# Patient Record
Sex: Female | Born: 1995 | Race: White | Hispanic: No | Marital: Single | Smoking: Never smoker
Health system: Southern US, Community
[De-identification: ages and names within clinical notes are randomized; demographics above are authoritative.]

## PROBLEM LIST (undated history)

## (undated) ENCOUNTER — Ambulatory Visit

## (undated) DIAGNOSIS — R5383 Other fatigue: Secondary | ICD-10-CM

## (undated) DIAGNOSIS — E559 Vitamin D deficiency, unspecified: Secondary | ICD-10-CM

## (undated) DIAGNOSIS — D649 Anemia, unspecified: Secondary | ICD-10-CM

## (undated) DIAGNOSIS — N912 Amenorrhea, unspecified: Secondary | ICD-10-CM

## (undated) DIAGNOSIS — W3400XA Accidental discharge from unspecified firearms or gun, initial encounter: Secondary | ICD-10-CM

## (undated) DIAGNOSIS — R0602 Shortness of breath: Secondary | ICD-10-CM

## (undated) DIAGNOSIS — G935 Compression of brain: Secondary | ICD-10-CM

## (undated) DIAGNOSIS — K602 Anal fissure, unspecified: Secondary | ICD-10-CM

## (undated) DIAGNOSIS — D169 Benign neoplasm of bone and articular cartilage, unspecified: Secondary | ICD-10-CM

## (undated) HISTORY — DX: Amenorrhea, unspecified: N91.2

## (undated) HISTORY — DX: Anemia, unspecified: D64.9

## (undated) HISTORY — DX: Vitamin D deficiency, unspecified: E55.9

## (undated) HISTORY — DX: Shortness of breath: R06.02

## (undated) HISTORY — DX: Other fatigue: R53.83

## (undated) HISTORY — DX: Anal fissure, unspecified: K60.2

## (undated) HISTORY — PX: BRAIN SURGERY: SHX531

## (undated) HISTORY — DX: Accidental discharge from unspecified firearms or gun, initial encounter: W34.00XA

---
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
## 2015-02-06 DIAGNOSIS — Y92410 Unspecified street and highway as the place of occurrence of the external cause: Secondary | ICD-10-CM | POA: Insufficient documentation

## 2015-02-06 DIAGNOSIS — S139XXA Sprain of joints and ligaments of unspecified parts of neck, initial encounter: Secondary | ICD-10-CM | POA: Diagnosis not present

## 2015-02-06 DIAGNOSIS — Y9389 Activity, other specified: Secondary | ICD-10-CM | POA: Diagnosis not present

## 2015-02-06 DIAGNOSIS — Z86018 Personal history of other benign neoplasm: Secondary | ICD-10-CM | POA: Insufficient documentation

## 2015-02-06 DIAGNOSIS — S060X1A Concussion with loss of consciousness of 30 minutes or less, initial encounter: Secondary | ICD-10-CM | POA: Diagnosis not present

## 2015-02-06 DIAGNOSIS — S0990XA Unspecified injury of head, initial encounter: Secondary | ICD-10-CM | POA: Diagnosis present

## 2015-02-06 DIAGNOSIS — Z87728 Personal history of other specified (corrected) congenital malformations of nervous system and sense organs: Secondary | ICD-10-CM | POA: Insufficient documentation

## 2015-02-06 DIAGNOSIS — Y999 Unspecified external cause status: Secondary | ICD-10-CM | POA: Insufficient documentation
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
## 2015-02-07 ENCOUNTER — Emergency Department (HOSPITAL_COMMUNITY)
Admission: EM | Admit: 2015-02-07 | Discharge: 2015-02-07 | Disposition: A | Discharge status: Home / Self Care | Payer: BLUE CROSS/BLUE SHIELD | Attending: Emergency Medicine | Admitting: Emergency Medicine

## 2015-02-07 ENCOUNTER — Encounter (HOSPITAL_COMMUNITY): Payer: Self-pay | Admitting: *Deleted

## 2015-02-07 DIAGNOSIS — S060X1A Concussion with loss of consciousness of 30 minutes or less, initial encounter: Secondary | ICD-10-CM

## 2015-02-07 DIAGNOSIS — M62838 Other muscle spasm: Secondary | ICD-10-CM

## 2015-02-07 DIAGNOSIS — S139XXA Sprain of joints and ligaments of unspecified parts of neck, initial encounter: Secondary | ICD-10-CM

## 2015-02-07 MED ORDER — IBUPROFEN 600 MG PO TABS: 600.0000 mg | ORAL_TABLET | Freq: Four times a day (QID) | ORAL | Status: DC | PRN

## 2015-02-07 MED ORDER — OXYCODONE-ACETAMINOPHEN 5-325 MG PO TABS: 1.0000 | ORAL_TABLET | ORAL | Status: DC | PRN

## 2015-02-07 MED ORDER — KETOROLAC TROMETHAMINE 30 MG/ML IJ SOLN
30.0000 mg | Freq: Once | INTRAMUSCULAR | Status: AC
  Administered 2015-02-07: 30 mg via INTRAVENOUS
  Filled 2015-02-07: qty 1

## 2015-02-07 MED ORDER — CYCLOBENZAPRINE HCL 10 MG PO TABS: 10.0000 mg | ORAL_TABLET | Freq: Two times a day (BID) | ORAL | Status: DC | PRN

## 2015-02-07 MED ORDER — METOCLOPRAMIDE HCL 5 MG/ML IJ SOLN
10.0000 mg | Freq: Once | INTRAMUSCULAR | Status: AC
  Administered 2015-02-07: 10 mg via INTRAVENOUS
  Filled 2015-02-07: qty 2

## 2015-02-07 MED ORDER — DIPHENHYDRAMINE HCL 50 MG/ML IJ SOLN
25.0000 mg | Freq: Once | INTRAMUSCULAR | Status: AC
  Administered 2015-02-07: 25 mg via INTRAVENOUS
  Filled 2015-02-07: qty 1

## 2015-02-07 NOTE — ED Notes (Addendum)
Pt was at [facility], states new onset on "confusion" per family. Pt is alert and oriented X 4, also c/o sharp shooting pain from her neck down to her shoulders. Family also requesting results from [facility].
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
## 2015-02-07 NOTE — Discharge Instructions (Signed)
Concussion, Adult °A concussion, or closed-head injury, is a brain injury caused by a direct blow to the head or by a quick and sudden movement (jolt) of the head or neck. Concussions are usually not life-threatening. Even so, the effects of a concussion can be serious. If you have had a concussion before, you are more likely to experience concussion-like symptoms after a direct blow to the head.  °CAUSES °· Direct blow to the head, such as from running into another player during a soccer game, being hit in a fight, or hitting your head on a hard surface. °· A jolt of the head or neck that causes the brain to move back and forth inside the skull, such as in a car crash. °SIGNS AND SYMPTOMS °The signs of a concussion can be hard to notice. Early on, they may be missed by you, family members, and health care providers. You may look fine but act or feel differently. °Symptoms are usually temporary, but they may last for days, weeks, or even longer. Some symptoms may appear right away while others may not show up for hours or days. Every head injury is different. Symptoms include: °· Mild to moderate headaches that will not go away. °· A feeling of pressure inside your head. °· Having more trouble than usual: °¨ Learning or remembering things you have heard. °¨ Answering questions. °¨ Paying attention or concentrating. °¨ Organizing daily tasks. °¨ Making decisions and solving problems. °· Slowness in thinking, acting or reacting, speaking, or reading. °· Getting lost or being easily confused. °· Feeling tired all the time or lacking energy (fatigued). °· Feeling drowsy. °· Sleep disturbances. °¨ Sleeping more than usual. °¨ Sleeping less than usual. °¨ Trouble falling asleep. °¨ Trouble sleeping (insomnia). °· Loss of balance or feeling lightheaded or dizzy. °· Nausea or vomiting. °· Numbness or tingling. °· Increased sensitivity to: °¨ Sounds. °¨ Lights. °¨ Distractions. °· Vision problems or eyes that tire  easily. °· Diminished sense of taste or smell. °· Ringing in the ears. °· Mood changes such as feeling sad or anxious. °· Becoming easily irritated or angry for little or no reason. °· Lack of motivation. °· Seeing or hearing things other people do not see or hear (hallucinations). °DIAGNOSIS °Your health care provider can usually diagnose a concussion based on a description of your injury and symptoms. He or she will ask whether you passed out (lost consciousness) and whether you are having trouble remembering events that happened right before and during your injury. °Your evaluation might include: °· A brain scan to look for signs of injury to the brain. Even if the test shows no injury, you may still have a concussion. °· Blood tests to be sure other problems are not present. °TREATMENT °· Concussions are usually treated in an emergency department, in urgent care, or at a clinic. You may need to stay in the hospital overnight for further treatment. °· Tell your health care provider if you are taking any medicines, including prescription medicines, over-the-counter medicines, and natural remedies. Some medicines, such as blood thinners (anticoagulants) and aspirin, may increase the chance of complications. Also tell your health care provider whether you have had alcohol or are taking illegal drugs. This information may affect treatment. °· Your health care provider will send you home with important instructions to follow. °· How fast you will recover from a concussion depends on many factors. These factors include how severe your concussion is, what part of your brain was injured,   your age, and how healthy you were before the concussion. °· Most people with mild injuries recover fully. Recovery can take time. In general, recovery is slower in older persons. Also, persons who have had a concussion in the past or have other medical problems may find that it takes longer to recover from their current injury. °HOME  CARE INSTRUCTIONS °General Instructions °· Carefully follow the directions your health care provider gave you. °· Only take over-the-counter or prescription medicines for pain, discomfort, or fever as directed by your health care provider. °· Take only those medicines that your health care provider has approved. °· Do not drink alcohol until your health care provider says you are well enough to do so. Alcohol and certain other drugs may slow your recovery and can put you at risk of further injury. °· If it is harder than usual to remember things, write them down. °· If you are easily distracted, try to do one thing at a time. For example, do not try to watch TV while fixing dinner. °· Talk with family members or close friends when making important decisions. °· Keep all follow-up appointments. Repeated evaluation of your symptoms is recommended for your recovery. °· Watch your symptoms and tell others to do the same. Complications sometimes occur after a concussion. Older adults with a brain injury may have a higher risk of serious complications, such as a blood clot on the brain. °· Tell your teachers, school nurse, school counselor, coach, athletic trainer, or work manager about your injury, symptoms, and restrictions. Tell them about what you can or cannot do. They should watch for: °¨ Increased problems with attention or concentration. °¨ Increased difficulty remembering or learning new information. °¨ Increased time needed to complete tasks or assignments. °¨ Increased irritability or decreased ability to cope with stress. °¨ Increased symptoms. °· Rest. Rest helps the brain to heal. Make sure you: °¨ Get plenty of sleep at night. Avoid staying up late at night. °¨ Keep the same bedtime hours on weekends and weekdays. °¨ Rest during the day. Take daytime naps or rest breaks when you feel tired. °· Limit activities that require a lot of thought or concentration. These include: °¨ Doing homework or job-related  work. °¨ Watching TV. °¨ Working on the computer. °· Avoid any situation where there is potential for another head injury (football, hockey, soccer, basketball, martial arts, downhill snow sports and horseback riding). Your condition will get worse every time you experience a concussion. You should avoid these activities until you are evaluated by the appropriate follow-up health care providers. °Returning To Your Regular Activities °You will need to return to your normal activities slowly, not all at once. You must give your body and brain enough time for recovery. °· Do not return to sports or other athletic activities until your health care provider tells you it is safe to do so. °· Ask your health care provider when you can drive, ride a bicycle, or operate heavy machinery. Your ability to react may be slower after a brain injury. Never do these activities if you are dizzy. °· Ask your health care provider about when you can return to work or school. °Preventing Another Concussion °It is very important to avoid another brain injury, especially before you have recovered. In rare cases, another injury can lead to permanent brain damage, brain swelling, or death. The risk of this is greatest during the first 7-10 days after a head injury. Avoid injuries by: °· Wearing a   seat belt when riding in a car.  Drinking alcohol only in moderation.  Wearing a helmet when biking, skiing, skateboarding, skating, or doing similar activities.  Avoiding activities that could lead to a second concussion, such as contact or recreational sports, until your health care provider says it is okay.  Taking safety measures in your home.  Remove clutter and tripping hazards from floors and stairways.  Use grab bars in bathrooms and handrails by stairs.  Place non-slip mats on floors and in bathtubs.  Improve lighting in dim areas. SEEK MEDICAL CARE IF:  You have increased problems paying attention or  concentrating.  You have increased difficulty remembering or learning new information.  You need more time to complete tasks or assignments than before.  You have increased irritability or decreased ability to cope with stress.  You have more symptoms than before. Seek medical care if you have any of the following symptoms for more than 2 weeks after your injury:  Lasting (chronic) headaches.  Dizziness or balance problems.  Nausea.  Vision problems.  Increased sensitivity to noise or light.  Depression or mood swings.  Anxiety or irritability.  Memory problems.  Difficulty concentrating or paying attention.  Sleep problems.  Feeling tired all the time. SEEK IMMEDIATE MEDICAL CARE IF:  You have severe or worsening headaches. These may be a sign of a blood clot in the brain.  You have weakness (even if only in one hand, leg, or part of the face).  You have numbness.  You have decreased coordination.  You vomit repeatedly.  You have increased sleepiness.  One pupil is larger than the other.  You have convulsions.  You have slurred speech.  You have increased confusion. This may be a sign of a blood clot in the brain.  You have increased restlessness, agitation, or irritability.  You are unable to recognize people or places.  You have neck pain.  It is difficult to wake you up.  You have unusual behavior changes.  You lose consciousness. MAKE SURE YOU:  Understand these instructions.  Will watch your condition.  Will get help right away if you are not doing well or get worse.   This information is not intended to replace advice given to you by your health care provider. Make sure you discuss any questions you have with your health care provider.   Document Released: 03/12/2003 Document Revised: 01/10/2014 Document Reviewed: 07/12/2012 Elsevier Interactive Patient Education 2016 [place] have neck pain, possibly from a cervical  strain and/or pinched nerve.   SEEK IMMEDIATE MEDICAL ATTENTION IF: You develop difficulties swallowing or breathing.  You have new or worse numbness, weakness, tingling, or movement problems in your arms or legs.  You develop increasing pain which is uncontrolled with medications.  You have change in bowel or bladder function, or other concerns.

## 2015-04-29 ENCOUNTER — Encounter: Payer: Self-pay | Admitting: Neurology

## 2015-04-29 ENCOUNTER — Ambulatory Visit (INDEPENDENT_AMBULATORY_CARE_PROVIDER_SITE_OTHER): Payer: BLUE CROSS/BLUE SHIELD | Admitting: Neurology

## 2015-04-29 VITALS — BP 112/66 | HR 78 | Resp 16 | Ht 64.0 in | Wt 166.0 lb

## 2015-04-29 DIAGNOSIS — F0781 Postconcussional syndrome: Secondary | ICD-10-CM

## 2015-04-29 DIAGNOSIS — S060X0S Concussion without loss of consciousness, sequela: Secondary | ICD-10-CM

## 2015-04-29 DIAGNOSIS — Z9889 Other specified postprocedural states: Secondary | ICD-10-CM

## 2015-04-29 DIAGNOSIS — M6289 Other specified disorders of muscle: Secondary | ICD-10-CM | POA: Diagnosis not present

## 2015-04-29 DIAGNOSIS — R531 Weakness: Secondary | ICD-10-CM
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
## 2015-04-29 NOTE — Patient Instructions (Addendum)
Post-concussion syndrome is a complex disorder which presents with a variety of symptoms, including headache, dizziness, cognitive issues, balance issues, mood related symptoms and motor issues. These can last for weeks and sometimes months after the initial head injury. Concussion is a mild form of traumatic brain injury, and usually occurs after a blow to the head. Loss of consciousness is not a requirement for the diagnosis of concussion or of postconcussion syndrome. The risk of post concussive symptoms does not appear to be correlated with the severity of the initial injury. Most people who have a concussion are without any residual symptoms. Some patients have symptoms that occur within the first 7-10 days of the initial injury and had complete resolution of her symptoms within 3 months. However, keep in mind that postconcussive symptoms can persist for a year and sometimes even longer. There is no specific test for the diagnosis of concussion. There is no specific treatment for concussion or postconcussive symptoms and care is supportive. Treatment is geared towards improving symptoms. Medications commonly used for migraines or tension headaches, including some antidepressants, appear to be helpful with postconcussive headaches. Medications include amitriptyline, Topamax, or gabapentin and others. Keep in mind that overuse of over-the-counter pain medications can exacerbate post concussion headaches.  There are no medications currently recommended specifically for cognitive complaints after mild traumatic brain injury. Usually, time is the best treatment for postconcussive cognitive issues as most of the cognitive complaints resolve on their own in the first few weeks or months after the injury. Sometimes a referral to a psychiatrist or psychologist is helpful. Certain forms of cognitive therapy may be helpful, and relaxation therapy may also help.  You had multiple scans done including head CT at the time  of her accident. All of the findings were fairly reassuring.  We will do an EEG (brainwave test), which we will schedule. We will call you with the results.  We will do a brain scan, called MRI and call you with the test results. We will have to schedule you for this on a separate date. This test requires authorization from your insurance, and we will take care of the insurance process.  We will consider a formal neuropsychological test (aka cognitive testing) for your memory complaints. This requires a referral to a trained and licensed neuropsychologist and will be a separate appointment at a different clinic. We will discuss at our 3 month follow up. We will give it some more time at this juncture.   You have stable R arm and hand weakness, otherwise a normal neurological exam.  Please remember to try to maintain good sleep hygiene, which means: Keep a regular sleep and wake schedule, try not to exercise or have a meal within 2 hours of your bedtime, try to keep your bedroom conducive for sleep, that is, cool and dark, without light distractors such as an illuminated alarm clock, and refrain from watching TV right before sleep or in the middle of the night and do not keep the TV or radio on during the night. Also, try not to use or play on electronic devices at bedtime, such as your cell phone, tablet PC or laptop. If you like to read at bedtime on an electronic device, try to dim the background light as much as possible. Do not eat in the middle of the night.   Stay well hydrated with water.

## 2015-05-06 ENCOUNTER — Other Ambulatory Visit: Payer: BLUE CROSS/BLUE SHIELD

## 2015-05-26 ENCOUNTER — Other Ambulatory Visit: Payer: BLUE CROSS/BLUE SHIELD

## 2015-07-01 ENCOUNTER — Ambulatory Visit (INDEPENDENT_AMBULATORY_CARE_PROVIDER_SITE_OTHER): Payer: BLUE CROSS/BLUE SHIELD

## 2015-07-01 DIAGNOSIS — S060X0S Concussion without loss of consciousness, sequela: Secondary | ICD-10-CM | POA: Diagnosis not present

## 2015-07-01 DIAGNOSIS — F0781 Postconcussional syndrome: Secondary | ICD-10-CM | POA: Diagnosis not present

## 2015-07-01 DIAGNOSIS — M6289 Other specified disorders of muscle: Secondary | ICD-10-CM | POA: Diagnosis not present

## 2015-07-01 DIAGNOSIS — Z9889 Other specified postprocedural states: Secondary | ICD-10-CM

## 2015-07-01 DIAGNOSIS — R531 Weakness: Secondary | ICD-10-CM

## 2015-07-02 MED ORDER — GADOPENTETATE DIMEGLUMINE 469.01 MG/ML IV SOLN: 15.0000 mL | Freq: Once | INTRAVENOUS | Status: DC | PRN

## 2015-07-03 NOTE — Progress Notes (Signed)
Quick Note:  Please call patient: MRI brain w/wo contrast confirms postsurgical changes from her decompression surgery in 2011 for her Chiari malformation, otherwise normal looking brain and cerebellum and normal looking upper cervical cord as much as is visible on a brain MRI. No acute findings, reassuring results. Star Age, MD, PhD [facility] ([facility])  ______

## 2015-07-06 ENCOUNTER — Telehealth: Payer: Self-pay

## 2015-07-06 NOTE — Telephone Encounter (Signed)
I spoke to patient and she is aware of results. She has not had EEG yet, I encouraged her to go ahead and get that scheduled before next appt.

## 2015-07-06 NOTE — Telephone Encounter (Signed)
-----   Message from Star Age, MD sent at 07/03/2015  1:28 PM EDT ----- Please call patient: MRI brain w/wo contrast confirms postsurgical changes from her decompression surgery in 2011 for her Chiari malformation, otherwise normal looking brain and cerebellum and normal looking upper cervical cord as much as is visible on a brain MRI. No acute findings, reassuring results. Star Age, MD, PhD [facility])

## 2015-07-30 ENCOUNTER — Ambulatory Visit: Payer: BLUE CROSS/BLUE SHIELD | Admitting: Neurology

## 2015-08-04 ENCOUNTER — Ambulatory Visit: Payer: BLUE CROSS/BLUE SHIELD | Admitting: Neurology

## 2015-08-05 ENCOUNTER — Encounter: Payer: Self-pay | Admitting: Neurology

## 2016-01-25 ENCOUNTER — Encounter: Payer: Self-pay | Admitting: Gastroenterology

## 2016-02-02 ENCOUNTER — Encounter: Payer: Self-pay | Admitting: Gastroenterology

## 2016-02-02 ENCOUNTER — Ambulatory Visit (INDEPENDENT_AMBULATORY_CARE_PROVIDER_SITE_OTHER): Payer: BLUE CROSS/BLUE SHIELD | Admitting: Gastroenterology

## 2016-02-02 VITALS — BP 86/50 | HR 88 | Ht 63.0 in | Wt 172.5 lb

## 2016-02-02 DIAGNOSIS — K602 Anal fissure, unspecified: Secondary | ICD-10-CM

## 2016-02-02 MED ORDER — DILTIAZEM GEL 2 %: 1.0000 "application " | Freq: Three times a day (TID) | CUTANEOUS | 3 refills | Status: DC

## 2016-02-02 NOTE — Progress Notes (Signed)
HPI: This is a  very pleasant 21 year old woman  who was referred to me by Vernie Shanks, MD  to evaluate  chronic anal fissure .    Chief complaint is anal fissure  Old Data Reviewed: [facility] note reviewed from 07/2015" persistent anal fissure" treated with NTg ointments without success, referral to GI.  Anal fissure for about a year.  Started after she moved into dorms, had many bouts of constipation then.  Has Tried NTg cream for 2 months without improvement.    Hurts with every BM.  Has BM every other day.  Will streak with blood with BM as well.  She has no fevers or chills. No significant diarrhea   Review of systems: Pertinent positive and negative review of systems were noted in the above HPI section. Complete review of systems was performed and was otherwise normal.   Past Medical History:  Diagnosis Date  . 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 Bone tumor (benign)     Past Surgical History:  Procedure Laterality Date  . BRAIN SURGERY     arnold chiari malformation repair in 2010    No current outpatient prescriptions on file.   No current facility-administered medications for this visit.    Facility-Administered Medications Ordered in Other Visits  Medication Dose Route Frequency Provider Last Rate Last Dose  . gadopentetate dimeglumine (MAGNEVIST) injection 15 mL  15 mL Intravenous Once PRN Star Age, MD        Allergies as of 02/02/2016  . (No Known Allergies)    Family History  Problem Relation Age of Onset  . Healthy Mother   . Colon cancer Neg Hx   . Rectal cancer Neg Hx   . Stomach cancer Neg Hx   . Liver cancer Neg Hx   . Esophageal cancer Neg Hx     Social History   Social History  . Marital status: Single    Spouse name: N/A  . Number of children: 0  . Years of education: N/A   Occupational History  . STUDENT    Social History Main Topics  . Smoking status: Never Smoker  .  Smokeless tobacco: Never Used  . Alcohol use No  . Drug use: No  . Sexual activity: Not on file   Other Topics Concern  . Not on file   Social History Narrative   Rare caffeine use      Physical Exam: BP (!) 86/50   Pulse 88   Ht 5\' 3"  (1.6 m)   Wt 172 lb 8 oz (78.2 kg)   BMI 30.56 kg/m  Constitutional: generally well-appearing Psychiatric: alert and oriented x3 Eyes: extraocular movements intact Mouth: oral pharynx moist, no lesions Neck: supple no lymphadenopathy Cardiovascular: heart regular rate and rhythm Lungs: clear to auscultation bilaterally Abdomen: soft, nontender, nondistended, no obvious ascites, no peritoneal signs, normal bowel sounds Extremities: no lower extremity edema bilaterally Skin: no lesions on visible extremities Rectal exam with female assistant in the room: Shallow, 5-8 mm midline posterior anal fissure that was tender to palpation, no fluctuance. I did not insert finger into the rectum due to the pain  Assessment and plan: 21 y.o. female with  chronic anal fissure  We're going to try with conservative measures a bit further before referral to surgeon for fissurotomy; diltiazem gel 3 times daily, sitz baths once to twice daily, fiber supplements. She'll return to see me in 6-8 weeks and sooner  if needed. If not having any significant progress by then I will likely refer her to a general surgeon.  No signs of underlying IBD    Please see the "Patient Instructions" section for addition details about the plan.   Owens Loffler, MD [facility] 02/02/2016, 2:04 PM  Cc: Vernie Shanks, MD

## 2016-02-02 NOTE — Patient Instructions (Addendum)
Trial of diltiazem gel, applied three times daily to the anal fissure for the next 6 weeks.  Sitz baths twice daily in warm water.  We have sent a prescription for Diltiazem gel 2%  to [facility]. You should apply a pea size amount to your rectum three times daily x 6-8 weeks.  [facility]'s information is below: Address: [address] [place], [place] [address]  [phone]  Please start taking citrucel (orange flavored) powder fiber supplement.  This may cause some bloating at first but that usually goes away. Begin with a small spoonful and work your way up to a large, heaping spoonful daily over a week.  Please return to see Dr. Ardis Hughs on 03/23/16 3:45pm

## 2016-03-12 ENCOUNTER — Encounter (HOSPITAL_COMMUNITY): Payer: Self-pay | Admitting: Emergency Medicine

## 2016-03-12 ENCOUNTER — Emergency Department (HOSPITAL_COMMUNITY): Payer: BLUE CROSS/BLUE SHIELD

## 2016-03-12 ENCOUNTER — Emergency Department (HOSPITAL_COMMUNITY)
Admission: EM | Admit: 2016-03-12 | Discharge: 2016-03-12 | Disposition: A | Discharge status: Home / Self Care | Payer: BLUE CROSS/BLUE SHIELD | Attending: Emergency Medicine | Admitting: Emergency Medicine

## 2016-03-12 DIAGNOSIS — R1011 Right upper quadrant pain: Secondary | ICD-10-CM | POA: Diagnosis not present

## 2016-03-12 DIAGNOSIS — L03113 Cellulitis of right upper limb: Secondary | ICD-10-CM | POA: Diagnosis not present

## 2016-03-12 DIAGNOSIS — R109 Unspecified abdominal pain: Secondary | ICD-10-CM

## 2016-03-12 LAB — URINALYSIS, ROUTINE W REFLEX MICROSCOPIC
Bacteria, UA: NONE SEEN
Bilirubin Urine: NEGATIVE
Glucose, UA: NEGATIVE mg/dL
Ketones, ur: NEGATIVE mg/dL
Leukocytes, UA: NEGATIVE
Nitrite: NEGATIVE
Protein, ur: NEGATIVE mg/dL
Specific Gravity, Urine: 1.01 (ref 1.005–1.030)
pH: 6 (ref 5.0–8.0)

## 2016-03-12 LAB — CBC WITH DIFFERENTIAL/PLATELET
Basophils Absolute: 0 10*3/uL (ref 0.0–0.1)
Basophils Relative: 0 %
Eosinophils Absolute: 0.1 10*3/uL (ref 0.0–0.7)
Eosinophils Relative: 2 %
HCT: 37.1 % (ref 36.0–46.0)
Hemoglobin: 11.9 g/dL — ABNORMAL LOW (ref 12.0–15.0)
Lymphocytes Relative: 28 %
Lymphs Abs: 2 10*3/uL (ref 0.7–4.0)
MCH: 27.7 pg (ref 26.0–34.0)
MCHC: 32.1 g/dL (ref 30.0–36.0)
MCV: 86.3 fL (ref 78.0–100.0)
Monocytes Absolute: 0.6 10*3/uL (ref 0.1–1.0)
Monocytes Relative: 9 %
Neutro Abs: 4.3 10*3/uL (ref 1.7–7.7)
Neutrophils Relative %: 61 %
Platelets: 224 10*3/uL (ref 150–400)
RBC: 4.3 MIL/uL (ref 3.87–5.11)
RDW: 13 % (ref 11.5–15.5)
WBC: 7.1 10*3/uL (ref 4.0–10.5)

## 2016-03-12 LAB — COMPREHENSIVE METABOLIC PANEL
ALT: 12 U/L — ABNORMAL LOW (ref 14–54)
AST: 15 U/L (ref 15–41)
Albumin: 3.7 g/dL (ref 3.5–5.0)
Alkaline Phosphatase: 45 U/L (ref 38–126)
Anion gap: 10 (ref 5–15)
BUN: 11 mg/dL (ref 6–20)
CO2: 23 mmol/L (ref 22–32)
Calcium: 9 mg/dL (ref 8.9–10.3)
Chloride: 104 mmol/L (ref 101–111)
Creatinine, Ser: 0.73 mg/dL (ref 0.44–1.00)
GFR calc Af Amer: >60 mL/min (ref >60)
GFR calc non Af Amer: >60 mL/min (ref >60)
Glucose, Bld: 88 mg/dL (ref 65–99)
Potassium: 4 mmol/L (ref 3.5–5.1)
Sodium: 137 mmol/L (ref 135–145)
Total Bilirubin: 0.6 mg/dL (ref 0.3–1.2)
Total Protein: 6.4 g/dL — ABNORMAL LOW (ref 6.5–8.1)

## 2016-03-12 LAB — POC URINE PREG, ED: Preg Test, Ur: NEGATIVE

## 2016-03-12 LAB — LIPASE, BLOOD: Lipase: 13 U/L (ref 11–51)

## 2016-03-12 MED ORDER — CEPHALEXIN 500 MG PO CAPS: 500.0000 mg | ORAL_CAPSULE | Freq: Four times a day (QID) | ORAL | 0 refills | Status: DC

## 2016-03-12 NOTE — ED Triage Notes (Signed)
Pt. Stated, I've been having off and on cramping for 2 days.  Also I have this scaliness on my rt. Arm were I busted 2 boils.

## 2016-03-12 NOTE — ED Provider Notes (Signed)
[facility] DEPT Provider Note   CSN: [id number] Arrival date & time: 03/12/16  1138     History   Chief Complaint Chief Complaint  Patient presents with  . Abdominal Cramping  . Arm Pain    HPI Sonya Hatfield is a 21 y.o. female.  The history is provided by the patient and a relative. No language interpreter was used.  Abdominal Cramping   Arm Pain    Sonya Hatfield is a 21 y.o. female who presents to the Emergency Department complaining of abdominal pain.  She reports 2-3 days of intermittent right upper quadrant pain that radiates to the right back. Pain is severe and waxing and waning in nature. When it occurs is worse with movement and deep breaths but she has no symptoms in between episodes. No fevers, nausea, vomiting, diarrhea, dysuria, constipation. No prior similar symptoms. She also reports bumps to her right arm that were pills that she squeezed them popped a few days ago and now she has developing redness and swelling to the right forearm and elbow. She has pain in that area but is able to range it without difficulty. Past Medical History:  Diagnosis Date  . 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 Bone tumor (benign)     There are no active problems to display for this patient.   Past Surgical History:  Procedure Laterality Date  . BRAIN SURGERY     arnold chiari malformation repair in 2010    OB History    No data available       Home Medications    Prior to Admission medications   Medication Sig Start Date End Date Taking? Authorizing Provider  cephALEXin (KEFLEX) 500 MG capsule Take 1 capsule (500 mg total) by mouth 4 (four) times daily. 03/12/16   Quintella Reichert, MD  diltiazem 2 % GEL Apply 1 application topically 3 (three) times daily. 6-8 weeks 02/02/16   Milus Banister, MD    Family History Family History  Problem Relation Age of Onset  . Healthy Mother   . Other Father     history of bone tumor    . Colon cancer Neg Hx   . Rectal cancer Neg Hx   . Stomach cancer Neg Hx   . Liver cancer Neg Hx   . Esophageal cancer Neg Hx     Social History Social History  Substance Use Topics  . Smoking status: Never Smoker  . Smokeless tobacco: Never Used  . Alcohol use No     Allergies   Patient has no known allergies.   Review of Systems Review of Systems  All other systems reviewed and are negative.    Physical Exam Updated Vital Signs BP 93/67 (BP Location: Right Arm)   Pulse 76   Temp 98.9 F (37.2 C) (Oral)   Resp 18   Ht 5\' 4"  (1.626 m)   Wt 167 lb (75.8 kg)   LMP 03/08/2016   SpO2 100%   BMI 28.67 kg/m   Physical Exam  Constitutional: She is oriented to person, place, and time. She appears well-developed and well-nourished.  HENT:  Head: Normocephalic and atraumatic.  Cardiovascular: Normal rate and regular rhythm.   No murmur heard. Pulmonary/Chest: Effort normal and breath sounds normal. No respiratory distress.  Abdominal: Soft. There is no tenderness. There is no rebound and no guarding.  Musculoskeletal:  2+ radial pulses bilaterally. There is mild erythema and firm induration with mild local  tenderness to the right proximal forearm and up to the elbow. Range of motion is intact throughout the elbow. No focal fluctuance or abscess.  Neurological: She is alert and oriented to person, place, and time.  Skin: Skin is warm and dry.  Psychiatric: She has a normal mood and affect. Her behavior is normal.  Nursing note and vitals reviewed.    ED Treatments / Results  Labs (all labs ordered are listed, but only abnormal results are displayed) Labs Reviewed  URINALYSIS, ROUTINE W REFLEX MICROSCOPIC - Abnormal; Notable for the following:       Result Value   Color, Urine STRAW (*)    Hgb urine dipstick LARGE (*)    Squamous Epithelial / LPF 0-5 (*)    All other components within normal limits  COMPREHENSIVE METABOLIC PANEL - Abnormal; Notable for the  following:    Total Protein 6.4 (*)    ALT 12 (*)    All other components within normal limits  CBC WITH DIFFERENTIAL/PLATELET - Abnormal; Notable for the following:    Hemoglobin 11.9 (*)    All other components within normal limits  LIPASE, BLOOD  POC URINE PREG, ED    EKG  EKG Interpretation None       Radiology Dg Elbow Complete Right  Result Date: 03/12/2016 CLINICAL DATA:  Two boils involving the posterior aspect of the right elbow EXAM: RIGHT ELBOW - COMPLETE 3+ VIEW COMPARISON:  None. FINDINGS: There is rather diffuse subcutaneous stranding and edema involving the posterior aspect of the elbow and imaged proximal forearm. No radiopaque foreign body or subcutaneous emphysema. No discrete area of osteolysis to suggest osteomyelitis. No fracture or elbow joint effusion.  Joint spaces are preserved. IMPRESSION: Nonspecific diffuse soft tissue swelling about the posterior aspect of the elbow and proximal forearm without associated radiopaque foreign body, subcutaneous emphysema or radiographic evidence of osteomyelitis. Electronically Signed   By: Sandi Mariscal M.D.   On: 03/12/2016 13:24   US Abdomen Complete  Result Date: 03/12/2016 CLINICAL DATA:  Right-sided abdominal pain for 3 days. EXAM: ABDOMEN ULTRASOUND COMPLETE COMPARISON:  None. FINDINGS: Gallbladder: No gallstones or gallbladder wall thickening. No pericholecystic fluid. The sonographer reports no sonographic Murphy's sign. Common bile duct: Diameter: 3 mm Liver: No focal lesion identified. Within normal limits in parenchymal echogenicity. IVC: No abnormality visualized. Pancreas: Visualized portion unremarkable. Spleen: Size and appearance within normal limits. Right Kidney: Length: 9.7 cm. Echogenicity within normal limits. No mass or hydronephrosis visualized. Left Kidney: Length: 10.6 cm. Echogenicity within normal limits. No mass or hydronephrosis visualized. Abdominal aorta: No aneurysm visualized. Other findings: None.  IMPRESSION: Unremarkable abdominal ultrasound. Specifically, no findings to explain the patient's history of right-sided pain. Electronically Signed   By: Misty Stanley M.D.   On: 03/12/2016 14:44    Procedures Procedures (including critical care time)  Medications Ordered in ED Medications - No data to display   Initial Impression / Assessment and Plan / ED Course  I have reviewed the triage vital signs and the nursing notes.  Pertinent labs & imaging results that were available during my care of the patient were reviewed by me and considered in my medical decision making (see chart for details).    Patient here for evaluation of intermittent right-sided abdominal pain, no appreciable pain in the emergency department at this time. She is also here for evaluation of swelling to her right arm. Arm exam is concerning for cellulitis. There is no evidence of abscess or deep tissue space  infection. No evidence of septic arthritis. Will treat with Keflex. In terms of her abdominal pain, presentation is not consistent with cholecystitis, pancreatitis, perforated viscus, peptic ulcer disease, renal colic. UA with hematuria the patient is currently on her cycle. The patient on home care for abdominal pain, cellulitis.  Discussed outpatient follow-up and return precautions.   Final Clinical Impressions(s) / ED Diagnoses   Final diagnoses:  Right sided abdominal pain  Cellulitis of right upper extremity    New Prescriptions Discharge Medication List as of 03/12/2016  3:04 PM    START taking these medications   Details  cephALEXin (KEFLEX) 500 MG capsule Take 1 capsule (500 mg total) by mouth 4 (four) times daily., Starting Sat 03/12/2016, Print         Quintella Reichert, MD 03/12/16 [phone]

## 2016-03-12 NOTE — ED Notes (Signed)
Patient transported to X-ray 

## 2016-03-23 ENCOUNTER — Ambulatory Visit: Payer: BLUE CROSS/BLUE SHIELD | Admitting: Gastroenterology

## 2016-05-31 ENCOUNTER — Encounter (HOSPITAL_COMMUNITY): Payer: Self-pay | Admitting: *Deleted

## 2016-05-31 ENCOUNTER — Ambulatory Visit (HOSPITAL_COMMUNITY)
Admission: EM | Admit: 2016-05-31 | Discharge: 2016-05-31 | Disposition: A | Discharge status: Home / Self Care | Payer: BLUE CROSS/BLUE SHIELD | Attending: Family Medicine | Admitting: Family Medicine

## 2016-05-31 DIAGNOSIS — B9789 Other viral agents as the cause of diseases classified elsewhere: Secondary | ICD-10-CM | POA: Diagnosis not present

## 2016-05-31 DIAGNOSIS — J069 Acute upper respiratory infection, unspecified: Secondary | ICD-10-CM

## 2016-05-31 DIAGNOSIS — R05 Cough: Secondary | ICD-10-CM | POA: Diagnosis present

## 2016-05-31 DIAGNOSIS — H6123 Impacted cerumen, bilateral: Secondary | ICD-10-CM | POA: Diagnosis not present

## 2016-05-31 LAB — POCT RAPID STREP A: Streptococcus, Group A Screen (Direct): NEGATIVE

## 2016-05-31 MED ORDER — BENZONATATE 100 MG PO CAPS: 100.0000 mg | ORAL_CAPSULE | Freq: Three times a day (TID) | ORAL | 0 refills | Status: DC

## 2016-05-31 NOTE — Discharge Instructions (Signed)
You most likely have a viral URI, I advise rest, plenty of fluids and management of symptoms with over the counter medicines. For symptoms you may take Tylenol as needed every 4-6 hours for body aches or fever, not to exceed 4,000 mg a day, Take mucinex or mucinex DM ever 12 hours with a full glass of water, you may use an inhaled steroid such as Flonase, 2 sprays each nostril once a day for congestion, or an antihistamine such as Claritin or Zyrtec once a day. For cough, I have prescribed a medication called Tessalon. Take 1 tablet every 8 hours as needed for your cough. Should your symptoms worsen or fail to resolve, follow up with your primary care provider or return to clinic.

## 2016-05-31 NOTE — ED Triage Notes (Signed)
Sorethroat     And  Headache  X  4  Days    Developed   A    Cough    2  Days    Body  Aches

## 2016-05-31 NOTE — ED Provider Notes (Signed)
CSN: [id number]     Arrival date & time 05/31/16  1158 History   First MD Initiated Contact with Patient 05/31/16 1309     Chief Complaint  Patient presents with  . Cough   (Consider location/radiation/quality/duration/timing/severity/associated sxs/prior Treatment) The history is provided by the patient.  Cough  Cough characteristics:  Non-productive, dry and hacking Sputum characteristics:  Clear Severity:  Moderate Onset quality:  Gradual Duration:  4 days Timing:  Constant Progression:  Unchanged Chronicity:  New Smoker: no   Context: upper respiratory infection   Relieved by:  Nothing Worsened by:  Nothing Ineffective treatments:  None tried Associated symptoms: fever, rhinorrhea, sinus congestion and sore throat   Associated symptoms: no chest pain, no chills, no ear pain, no headaches and no wheezing     Past Medical History:  Diagnosis Date  . 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 Bone tumor (benign)    Past Surgical History:  Procedure Laterality Date  . BRAIN SURGERY     arnold chiari malformation repair in 2010   Family History  Problem Relation Age of Onset  . Healthy Mother   . Other Father        history of bone tumor  . Colon cancer Neg Hx   . Rectal cancer Neg Hx   . Stomach cancer Neg Hx   . Liver cancer Neg Hx   . Esophageal cancer Neg Hx    Social History  Substance Use Topics  . Smoking status: Never Smoker  . Smokeless tobacco: Never Used  . Alcohol use No   OB History    No data available     Review of Systems  Constitutional: Positive for fever. Negative for chills.  HENT: Positive for rhinorrhea and sore throat. Negative for ear pain.   Respiratory: Positive for cough. Negative for wheezing.   Cardiovascular: Negative for chest pain.  Gastrointestinal: Negative.   Musculoskeletal: Negative.   Skin: Negative.   Neurological: Negative for light-headedness and headaches.    Allergies   Patient has no known allergies.  Home Medications   Prior to Admission medications   Medication Sig Start Date End Date Taking? Authorizing Provider  benzonatate (TESSALON) 100 MG capsule Take 1 capsule (100 mg total) by mouth every 8 (eight) hours. 05/31/16   Barnet Glasgow, NP   Meds Ordered and Administered this Visit  Medications - No data to display  BP (!) 98/54 (BP Location: Left Arm) Comment: notified rn  Pulse 70   Temp 98 F (36.7 C) (Oral)   Resp 16   LMP 05/17/2016   SpO2 100%  No data found.   Physical Exam  Constitutional: She is oriented to person, place, and time. She appears well-developed and well-nourished. No distress.  HENT:  Head: Normocephalic.  Right Ear: External ear normal.  Left Ear: External ear normal.  Nose: Nose normal.  Mouth/Throat: Uvula is midline, oropharynx is clear and moist and mucous membranes are normal.  Bilateral cerumen impaction  Eyes: Conjunctivae are normal.  Neck: Normal range of motion. Neck supple.  Cardiovascular: Normal rate and regular rhythm.   Pulmonary/Chest: Effort normal and breath sounds normal.  Neurological: She is alert and oriented to person, place, and time.  Skin: Skin is warm and dry. Capillary refill takes less than 2 seconds. She is not diaphoretic.  Psychiatric: She has a normal mood and affect. Her behavior is normal.  Nursing note and vitals reviewed.   Urgent Care  Course     Procedures (including critical care time)  Labs Review Labs Reviewed  POCT RAPID STREP A    Imaging Review No results found.    MDM   1. Viral URI with cough    Strep test negative, most likely viral URI. Given Tessalon for cough, provided counseling on over-the-counter therapies for symptom management, provided work note. Ears irrigated in clinic.     Barnet Glasgow, NP 05/31/16 1752

## 2016-06-02 LAB — CULTURE, GROUP A STREP (THRC)

## 2016-06-29 ENCOUNTER — Other Ambulatory Visit: Payer: Self-pay

## 2017-05-16 LAB — OB RESULTS CONSOLE HIV ANTIBODY (ROUTINE TESTING): HIV: NONREACTIVE

## 2017-05-16 LAB — OB RESULTS CONSOLE ABO/RH: RH Type: POSITIVE

## 2017-05-16 LAB — OB RESULTS CONSOLE GC/CHLAMYDIA
Chlamydia: NEGATIVE
Gonorrhea: NEGATIVE

## 2017-05-16 LAB — OB RESULTS CONSOLE ANTIBODY SCREEN: Antibody Screen: NEGATIVE

## 2017-05-16 LAB — OB RESULTS CONSOLE RPR: RPR: NONREACTIVE

## 2017-05-16 LAB — OB RESULTS CONSOLE RUBELLA ANTIBODY, IGM: Rubella: IMMUNE

## 2017-05-16 LAB — OB RESULTS CONSOLE HEPATITIS B SURFACE ANTIGEN: Hepatitis B Surface Ag: NEGATIVE

## 2017-05-22 DIAGNOSIS — Z3490 Encounter for supervision of normal pregnancy, unspecified, unspecified trimester: Secondary | ICD-10-CM | POA: Insufficient documentation

## 2017-05-23 DIAGNOSIS — E559 Vitamin D deficiency, unspecified: Secondary | ICD-10-CM | POA: Insufficient documentation

## 2017-05-30 DIAGNOSIS — G935 Compression of brain: Secondary | ICD-10-CM | POA: Insufficient documentation

## 2017-08-29 ENCOUNTER — Telehealth: Payer: Self-pay

## 2017-08-29 NOTE — Telephone Encounter (Signed)
I have asked our referrals team to let the referring office know that pt should see neurosurgery, either at [facility] or here locally.

## 2017-08-29 NOTE — Telephone Encounter (Signed)
Nothing further needed, other than I recommend opinion with neurosurgery.

## 2017-08-29 NOTE — Telephone Encounter (Signed)
I called pt per Dr. Guadelupe Sabin request. Dr. Rexene Alberts cannot comment on pt's Chiari Syndrome and her pregnancy. The referral states that pt has had a repair at [facility] and therefore Dr. Rexene Alberts recommends that pt see NSY at [facility] regarding this. Pt verbalized displeasure in this because she has waited a while for this appt. She will speak to her referring OBGYN regarding this. Pt's appt has been cancelled for tomorrow. Pt verbalized understanding.

## 2017-08-30 ENCOUNTER — Institutional Professional Consult (permissible substitution): Payer: BLUE CROSS/BLUE SHIELD | Admitting: Neurology

## 2017-09-05 DIAGNOSIS — D649 Anemia, unspecified: Secondary | ICD-10-CM | POA: Insufficient documentation

## 2017-09-06 NOTE — Telephone Encounter (Signed)
This encounter was created in error - please disregard.

## 2017-11-10 LAB — OB RESULTS CONSOLE GBS: GBS: NEGATIVE

## 2017-12-04 ENCOUNTER — Telehealth (HOSPITAL_COMMUNITY): Payer: Self-pay | Admitting: *Deleted

## 2017-12-04 ENCOUNTER — Encounter (HOSPITAL_COMMUNITY): Payer: Self-pay | Admitting: *Deleted

## 2017-12-04 NOTE — Telephone Encounter (Signed)
Preadmission screen  

## 2017-12-06 ENCOUNTER — Other Ambulatory Visit: Payer: Self-pay | Admitting: Obstetrics & Gynecology

## 2017-12-08 ENCOUNTER — Encounter (HOSPITAL_COMMUNITY): Payer: Self-pay

## 2017-12-08 ENCOUNTER — Inpatient Hospital Stay (HOSPITAL_COMMUNITY)
Admission: RE | Admit: 2017-12-08 | Discharge: 2017-12-13 | DRG: 786 | Disposition: A | Discharge status: Home / Self Care | Payer: BLUE CROSS/BLUE SHIELD | Attending: Obstetrics & Gynecology | Admitting: Obstetrics & Gynecology

## 2017-12-08 DIAGNOSIS — O1404 Mild to moderate pre-eclampsia, complicating childbirth: Secondary | ICD-10-CM | POA: Diagnosis present

## 2017-12-08 DIAGNOSIS — O324XX0 Maternal care for high head at term, not applicable or unspecified: Secondary | ICD-10-CM | POA: Diagnosis present

## 2017-12-08 DIAGNOSIS — O411230 Chorioamnionitis, third trimester, not applicable or unspecified: Secondary | ICD-10-CM | POA: Diagnosis present

## 2017-12-08 DIAGNOSIS — Z3A41 41 weeks gestation of pregnancy: Secondary | ICD-10-CM | POA: Diagnosis not present

## 2017-12-08 DIAGNOSIS — O9902 Anemia complicating childbirth: Secondary | ICD-10-CM | POA: Diagnosis present

## 2017-12-08 DIAGNOSIS — D649 Anemia, unspecified: Secondary | ICD-10-CM | POA: Diagnosis present

## 2017-12-08 DIAGNOSIS — O480 Post-term pregnancy: Secondary | ICD-10-CM | POA: Diagnosis present

## 2017-12-08 DIAGNOSIS — O1490 Unspecified pre-eclampsia, unspecified trimester: Secondary | ICD-10-CM

## 2017-12-08 LAB — CBC
HCT: 34.3 % — ABNORMAL LOW (ref 36.0–46.0)
Hemoglobin: 11.4 g/dL — ABNORMAL LOW (ref 12.0–15.0)
MCH: 27.7 pg (ref 26.0–34.0)
MCHC: 33.2 g/dL (ref 30.0–36.0)
MCV: 83.5 fL (ref 80.0–100.0)
Platelets: 171 10*3/uL (ref 150–400)
RBC: 4.11 MIL/uL (ref 3.87–5.11)
RDW: 13.6 % (ref 11.5–15.5)
WBC: 6.8 10*3/uL (ref 4.0–10.5)
nRBC: 0 % (ref 0.0–0.2)

## 2017-12-08 LAB — ABO/RH: ABO/RH(D): A POS

## 2017-12-08 LAB — TYPE AND SCREEN
ABO/RH(D): A POS
Antibody Screen: NEGATIVE

## 2017-12-08 MED ORDER — OXYTOCIN 40 UNITS IN LACTATED RINGERS INFUSION - SIMPLE MED: 2.5000 [IU]/h | INTRAVENOUS | Status: DC

## 2017-12-08 MED ORDER — LIDOCAINE HCL (PF) 1 % IJ SOLN
30.0000 mL | INTRAMUSCULAR | Status: AC | PRN
  Administered 2017-12-09: 5 mg via SUBCUTANEOUS

## 2017-12-08 MED ORDER — OXYTOCIN 40 UNITS IN LACTATED RINGERS INFUSION - SIMPLE MED
1.0000 m[IU]/min | INTRAVENOUS | Status: DC
  Administered 2017-12-08: 1 m[IU]/min via INTRAVENOUS
  Filled 2017-12-08: qty 1000

## 2017-12-08 MED ORDER — FENTANYL CITRATE (PF) 100 MCG/2ML IJ SOLN
50.0000 ug | INTRAMUSCULAR | Status: DC | PRN
  Administered 2017-12-09 – 2017-12-10 (×6): 100 ug via INTRAVENOUS
  Filled 2017-12-08 (×3): qty 2

## 2017-12-08 MED ORDER — SODIUM CHLORIDE 0.9 % IV SOLN: 250.0000 mL | INTRAVENOUS | Status: DC | PRN

## 2017-12-08 MED ORDER — ONDANSETRON HCL 4 MG/2ML IJ SOLN
4.0000 mg | Freq: Four times a day (QID) | INTRAMUSCULAR | Status: DC | PRN
  Filled 2017-12-08: qty 2

## 2017-12-08 MED ORDER — SOD CITRATE-CITRIC ACID 500-334 MG/5ML PO SOLN
30.0000 mL | ORAL | Status: DC | PRN
  Administered 2017-12-10: 30 mL via ORAL
  Filled 2017-12-08: qty 15

## 2017-12-08 MED ORDER — OXYTOCIN BOLUS FROM INFUSION: 500.0000 mL | Freq: Once | INTRAVENOUS | Status: DC

## 2017-12-08 MED ORDER — ACETAMINOPHEN 325 MG PO TABS: 650.0000 mg | ORAL_TABLET | ORAL | Status: DC | PRN

## 2017-12-08 MED ORDER — LACTATED RINGERS IV SOLN
500.0000 mL | INTRAVENOUS | Status: DC | PRN
  Administered 2017-12-10: 500 mL via INTRAVENOUS

## 2017-12-08 MED ORDER — MISOPROSTOL 50MCG HALF TABLET
50.0000 ug | ORAL_TABLET | Freq: Once | ORAL | Status: AC
  Administered 2017-12-08: 50 ug via ORAL
  Filled 2017-12-08: qty 1

## 2017-12-08 MED ORDER — LACTATED RINGERS IV SOLN
INTRAVENOUS | Status: DC
  Administered 2017-12-08 – 2017-12-09 (×3): via INTRAVENOUS

## 2017-12-08 MED ORDER — TERBUTALINE SULFATE 1 MG/ML IJ SOLN: 0.2500 mg | Freq: Once | INTRAMUSCULAR | Status: DC | PRN

## 2017-12-08 MED ORDER — OXYCODONE-ACETAMINOPHEN 5-325 MG PO TABS: 2.0000 | ORAL_TABLET | ORAL | Status: DC | PRN

## 2017-12-08 MED ORDER — BUTORPHANOL TARTRATE 1 MG/ML IJ SOLN
1.0000 mg | INTRAMUSCULAR | Status: DC | PRN
  Administered 2017-12-08 – 2017-12-09 (×4): 1 mg via INTRAVENOUS
  Filled 2017-12-08 (×4): qty 1

## 2017-12-08 MED ORDER — TERBUTALINE SULFATE 1 MG/ML IJ SOLN
0.2500 mg | Freq: Once | INTRAMUSCULAR | Status: DC | PRN
  Filled 2017-12-08: qty 1

## 2017-12-08 MED ORDER — SODIUM CHLORIDE 0.9% FLUSH: 3.0000 mL | INTRAVENOUS | Status: DC | PRN

## 2017-12-08 MED ORDER — OXYCODONE-ACETAMINOPHEN 5-325 MG PO TABS: 1.0000 | ORAL_TABLET | ORAL | Status: DC | PRN

## 2017-12-08 MED ORDER — FLEET ENEMA 7-19 GM/118ML RE ENEM: 1.0000 | ENEMA | RECTAL | Status: DC | PRN

## 2017-12-08 MED ORDER — SODIUM CHLORIDE 0.9% FLUSH: 3.0000 mL | Freq: Two times a day (BID) | INTRAVENOUS | Status: DC

## 2017-12-08 NOTE — Progress Notes (Signed)
Sonya Hatfield is a 22 y.o. G1P0 at 41+0 weeks presenting for postdates IOL.  Subjective: Sonya Hatfield states pain from contractions dropped from 10/10 to 2/10 with administration of 1mg  Stadol IV.   Objective: BP (!) 82/61 (BP Location: Left Arm)   Pulse 97   Temp 98.3 F (36.8 C) (Oral)   Resp 16   Ht 5\' 4"  (1.626 m)   Wt 95.8 kg   BMI 36.27 kg/m   FHT: Category 1 UC:   5-6/10mins SVE:   Dilation: 1 Effacement (%): 70 Station: -3 Exam by:: Sonya Hatfield  Assessment:  SIUP at [place] for post dates No cervical change x 5h NICHD Category 1 tracing  Plan: Discussed R/B of cervical ripening balloon. Pt considering. Will place at 2100 if pt agrees. Contracting too frequently for pitocin or misoprostol.   Sonya Hatfield, CNM 12/08/2017, 8:32 PM

## 2017-12-08 NOTE — Progress Notes (Signed)
Sonya Hatfield a 22 y.o. G1P0 at 41+0 weeks presenting for postdates IOL.  Subjective: Sonya Hatfield received 2nd dose of butorphanol and her pain is improved. She does not want to be up, out of bed.   Objective: BP 118/67   Pulse 91   Temp 98.3 F (36.8 C) (Oral)   Resp 17   Ht 5\' 4"  (1.626 m)   Wt 95.8 kg   BMI 36.27 kg/m     FHT: Category 1 UC:   3-4/10 minutes SVE:   Dilation: 1 Effacement (%): 70 Station: -3 Exam by:: Taylon Coole Pitocin: n/a  Assessment:  SIUP at [place] for post dates 1/70/-3 NICHD Category 1 tracing  Plan: Attempted to place Cook's catherter x 2, both unsuccessful. First time due to maternal intolerance/tensing up and second time because catheter would not advance. Monitor contractions closely and start low dose pitocin as able. Pain meds/epidural PRN. Plan for SVD.   Altha Harm, CNM 12/08/2017, 10:15 PM

## 2017-12-08 NOTE — Progress Notes (Signed)
Subjective: Was feeling contractions in the hour after the first dose, but none since then. Was sleeping.   Objective: BP 112/64   Pulse 94   Temp 97.8 F (36.6 C) (Oral)   Resp 18   Wt 95.8 kg   BMI 36.27 kg/m  No intake/output data recorded. No intake/output data recorded.  FHT:  Normal FHR. Mod variability. No decels UC:   Irregular SVE:   Dilation: 1 Effacement (%): 70 Station: -2 Exam by:: Kaite Felice Deem CNM Minimal change; more soft and more thin.  Labs: Lab Results  Component Value Date   WBC 6.8 12/08/2017   HGB 11.4 (L) 12/08/2017   HCT 34.3 (L) 12/08/2017   MCV 83.5 12/08/2017   PLT 171 12/08/2017    Assessment / Plan: cytotec 76mcg PO #2 Re-evaluate in 4h or sooner prn.  Labor: Progressing normally Preeclampsia:  NA Fetal Wellbeing:  Category I Pain Control:  Labor support without medications I/D:  n/a Anticipated MOD:  NSVD  Sonya Hatfield 12/08/2017, 2:51 PM
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
## 2017-12-09 ENCOUNTER — Inpatient Hospital Stay (HOSPITAL_COMMUNITY): Payer: BLUE CROSS/BLUE SHIELD | Admitting: Anesthesiology

## 2017-12-09 LAB — RPR: RPR Ser Ql: NONREACTIVE

## 2017-12-09 MED ORDER — EPHEDRINE 5 MG/ML INJ: 10.0000 mg | INTRAVENOUS | Status: DC | PRN

## 2017-12-09 MED ORDER — LACTATED RINGERS IV SOLN
500.0000 mL | Freq: Once | INTRAVENOUS | Status: AC
  Administered 2017-12-09: 500 mL via INTRAVENOUS

## 2017-12-09 MED ORDER — BUPIVACAINE HCL (PF) 0.25 % IJ SOLN
INTRAMUSCULAR | Status: DC | PRN
  Administered 2017-12-09: 10 mL via EPIDURAL
  Administered 2017-12-09 – 2017-12-10 (×3): 8 mL via EPIDURAL

## 2017-12-09 MED ORDER — LIDOCAINE HCL (PF) 1 % IJ SOLN
30.0000 mL | INTRAMUSCULAR | Status: DC | PRN
  Filled 2017-12-09: qty 30

## 2017-12-09 MED ORDER — FENTANYL CITRATE (PF) 100 MCG/2ML IJ SOLN
100.0000 ug | Freq: Once | INTRAMUSCULAR | Status: AC
  Administered 2017-12-09: 100 ug via INTRAVENOUS

## 2017-12-09 MED ORDER — LACTATED RINGERS IV SOLN: 500.0000 mL | Freq: Once | INTRAVENOUS | Status: DC

## 2017-12-09 MED ORDER — PHENYLEPHRINE 40 MCG/ML (10ML) SYRINGE FOR IV PUSH (FOR BLOOD PRESSURE SUPPORT): 80.0000 ug | PREFILLED_SYRINGE | INTRAVENOUS | Status: DC | PRN

## 2017-12-09 MED ORDER — DIPHENHYDRAMINE HCL 50 MG/ML IJ SOLN: 12.5000 mg | INTRAMUSCULAR | Status: DC | PRN

## 2017-12-09 MED ORDER — PHENYLEPHRINE 40 MCG/ML (10ML) SYRINGE FOR IV PUSH (FOR BLOOD PRESSURE SUPPORT)
80.0000 ug | PREFILLED_SYRINGE | INTRAVENOUS | Status: DC | PRN
  Filled 2017-12-09 (×2): qty 10

## 2017-12-09 MED ORDER — FENTANYL 2.5 MCG/ML BUPIVACAINE 1/10 % EPIDURAL INFUSION (WH - ANES)
14.0000 mL/h | INTRAMUSCULAR | Status: DC | PRN
  Administered 2017-12-09 – 2017-12-10 (×4): 14 mL/h via EPIDURAL
  Filled 2017-12-09 (×5): qty 100

## 2017-12-09 NOTE — Progress Notes (Signed)
Sonya Donovanis a 22 y.o.G1P0 at 41+0 weekshere for postdates IOL.  Subjective: Sonya Hatfield is resting in bed. She consented to a SVE and her cervix is unchanged. She does not want an epidural right now. Plan is for her to be up OOB, moving around, on yoga ball, etc this morning.   Objective: BP (!) 108/51   Pulse 76   Temp 98.2 F (36.8 C) (Oral)   Resp 16   Ht 5\' 4"  (1.626 m)   Wt 95.8 kg   BMI 36.27 kg/m     FHT: Category 1 UC:   Regular, 5 in 10 minutes SVE:   Dilation: 1 Effacement (%): 70 Station: -3 Exam by:: Baley Lorimer CNM Pitocin at 6 mu/min MVUs n/a  Assessment:  SIUP at [place] for post dates 1/70/-3 (0621) NICHD Category 1 tracing  Plan: Continue POC Position changes, [employer] for [employer], CNM 12/09/2017, 6:32 AM

## 2017-12-09 NOTE — Anesthesia Procedure Notes (Signed)

## 2017-12-09 NOTE — Plan of Care (Signed)
Pt verbalized understanding of POC.

## 2017-12-09 NOTE — Progress Notes (Signed)
Subjective:  Comfortable after epidural.  No complaints.  Objective: BP 110/61   Pulse 76   Temp 98.2 F (36.8 C) (Oral)   Resp 18   Ht 5\' 4"  (1.626 m)   Wt 95.8 kg   SpO2 93%   BMI 36.27 kg/m  No intake/output data recorded. Total I/O In: 1631.3 [I.V.:1631.3] Out: -   FHT: Category 1 UC:   regular, every 3 minutes SVE:   Dilation: 5 Effacement (%): 100 Station: Ballotable Exam by:: Aldridge Krzyzanowski, CNM Pitocin running MVUs N/A  Assessment:   Active labor at term Reassuring FHR status  Plan:  Unable to AROM due to ballot and not well aligned.   Will put in position changes to attempt to rotate baby into position AROM when able.  [place], MSN 12/09/2017, 12:28 PM

## 2017-12-09 NOTE — Progress Notes (Signed)
Sonya Hatfield a 22 y.o.G1P0 at 41+0 weekshere for postdates IOL.  Subjective: Sonya Hatfield continues to rate her contraction pain 7-8-9/10 and has requested a 4th dose of stadol. We discussed the r/b of epidural anesthesia and that we we may try the foley bulb again and some different positioning when we do get her comfortable. She decided to have another dose of stadol and then get her epidural in about an hour.  Objective: BP 119/68   Pulse 80   Temp 98.2 F (36.8 C) (Oral)   Resp 15   Ht 5\' 4"  (1.626 m)   Wt 95.8 kg   BMI 36.27 kg/m   FHT: Category 1 UC:   5 in 10 minutes SVE:  Deferred until more comfortable (1/70/-3 @ 2330) Pitocin at 6 mu/min MVUs n/a  Assessment:  SIUP at [place] for post dates 1/70/-3 NICHD Category 1 tracing  Plan: Epidural for pain mgmt SVE Position changes Foley bulb if still 1cm Anticipate SVD  Altha Harm, CNM 12/09/2017, 0430 AM

## 2017-12-09 NOTE — Progress Notes (Signed)
Subjective: Comfortable after bolus from anesthesia.  Was 9cm prior exam, continues to be 9cm  Objective: BP 114/68 (BP Location: Right Arm)   Pulse 79   Temp 98.2 F (36.8 C) (Oral)   Resp 18   Ht 5\' 4"  (1.626 m)   Wt 95.8 kg   SpO2 93%   BMI 36.27 kg/m  I/O last 3 completed shifts: In: 1631.3 [I.V.:1631.3] Out: -  No intake/output data recorded.  FHT: Category 1 UC:   regular, every 3 minutes SVE:   Dilation: 9 Effacement (%): 100 Station: -1 Exam by:: Alnita Aybar, CNM Pitocin at 18  MVUs n/a    Assessment:  Active labor at term Clear fluid Reassuring FHR  Plan:  IUPC placed, will prove adequacy.  Pt and family with questions regarding how long this is taking.  Reviewed that baby is likely OT and needs to rotate, and she must wait until fully dilated to push.  Baby still -1 station.  If unchanged by midnight with adequacy will consider diagnosis of first stage arrest.   Marjorie Smolder, MSN 12/09/2017, 10:48 PM    f

## 2017-12-09 NOTE — Anesthesia Preprocedure Evaluation (Signed)
Anesthesia Evaluation  Patient identified by MRN, date of birth, ID band Patient awake    Airway Mallampati: II  TM Distance: >3 FB Neck ROM: Full    Dental no notable dental hx. (+) Teeth Intact, Dental Advisory Given   Pulmonary neg pulmonary ROS,    Pulmonary exam normal breath sounds clear to auscultation       Cardiovascular Exercise Tolerance: Good negative cardio ROS Normal cardiovascular exam Rhythm:Regular Rate:Normal     Neuro/Psych Hx repair of Chiari Malformation negative psych ROS   GI/Hepatic negative GI ROS,   Endo/Other  negative endocrine ROS  Renal/GU negative Renal ROS     Musculoskeletal   Abdominal   Peds  Hematology  (+) anemia ,   Anesthesia Other Findings   Reproductive/Obstetrics (+) Pregnancy                             Lab Results  Component Value Date   WBC 6.8 12/08/2017   HGB 11.4 (L) 12/08/2017   HCT 34.3 (L) 12/08/2017   MCV 83.5 12/08/2017   PLT 171 12/08/2017    Anesthesia Physical Anesthesia Plan  ASA: III  Anesthesia Plan: Epidural   Post-op Pain Management:    Induction:   PONV Risk Score and Plan: Treatment may vary due to age or medical condition  Airway Management Planned:   Additional Equipment:   Intra-op Plan:   Post-operative Plan:   Informed Consent: I have reviewed the patients History and Physical, chart, labs and discussed the procedure including the risks, benefits and alternatives for the proposed anesthesia with the patient or authorized representative who has indicated his/her understanding and acceptance.     Plan Discussed with: CRNA  Anesthesia Plan Comments:         Anesthesia Quick Evaluation

## 2017-12-09 NOTE — Progress Notes (Addendum)
Subjective:  Feeling more pain/pressure, hasn't pushed bolus button yet.    Objective: BP 106/70   Pulse 74   Temp 98.2 F (36.8 C) (Oral)   Resp 18   Ht 5\' 4"  (1.626 m)   Wt 95.8 kg   SpO2 93%   BMI 36.27 kg/m  No intake/output data recorded. Total I/O In: 1631.3 [I.V.:1631.3] Out: -   FHT: Category 1 UC:   irregular, every 3 minutes SVE:   Dilation: 9 Effacement (%): 100 Station: -2 Exam by:: Erin, CNM Pitocin at 14 MVUs n/a  Assessment:   Active Labor Epidural Reassuring FHR  Plan:  Consider AROM - vtx better applied Will get comfortable first before proceeding. Dr. Alesia Richards in house  [place], MSN 12/09/2017, 2:59 PM

## 2017-12-09 NOTE — Anesthesia Pain Management Evaluation Note (Signed)
  CRNA Pain Management Visit Note  Patient: Sonya Hatfield, 22 y.o., female  "Hello I am a member of the anesthesia team at [facility]. We have an anesthesia team available at all times to provide care throughout the hospital, including epidural management and anesthesia for C-section. I don't know your plan for the delivery whether it a natural birth, water birth, IV sedation, nitrous supplementation, doula or epidural, but we want to meet your pain goals."   1.Was your pain managed to your expectations on prior hospitalizations?   No prior hospitalizations  2.What is your expectation for pain management during this hospitalization?     Epidural  3.How can we help you reach that goal? Epidural in place at time of visit  Record the patient's initial score and the patient's pain goal.   Pain: Patient sleeping - unable to assess  Pain Goal: Patient sleeping - unable to assess [facility] wants you to be able to say your pain was always managed very well.  Rayvon Char 12/09/2017

## 2017-12-10 ENCOUNTER — Encounter (HOSPITAL_COMMUNITY)
Admission: RE | Disposition: A | Discharge status: Home / Self Care | Payer: Self-pay | Source: Home / Self Care | Attending: Obstetrics & Gynecology

## 2017-12-10 DIAGNOSIS — O1490 Unspecified pre-eclampsia, unspecified trimester: Secondary | ICD-10-CM

## 2017-12-10 HISTORY — DX: Unspecified pre-eclampsia, unspecified trimester: O14.90

## 2017-12-10 LAB — CBC
HCT: 35.2 % — ABNORMAL LOW (ref 36.0–46.0)
Hemoglobin: 11.4 g/dL — ABNORMAL LOW (ref 12.0–15.0)
MCH: 26.8 pg (ref 26.0–34.0)
MCHC: 32.4 g/dL (ref 30.0–36.0)
MCV: 82.6 fL (ref 80.0–100.0)
Platelets: 144 10*3/uL — ABNORMAL LOW (ref 150–400)
RBC: 4.26 MIL/uL (ref 3.87–5.11)
RDW: 13.9 % (ref 11.5–15.5)
WBC: 15.1 10*3/uL — ABNORMAL HIGH (ref 4.0–10.5)
nRBC: 0 % (ref 0.0–0.2)

## 2017-12-10 LAB — COMPREHENSIVE METABOLIC PANEL
ALT: 13 U/L (ref 0–44)
AST: 22 U/L (ref 15–41)
Albumin: 2.2 g/dL — ABNORMAL LOW (ref 3.5–5.0)
Alkaline Phosphatase: 237 U/L — ABNORMAL HIGH (ref 38–126)
Anion gap: 12 (ref 5–15)
BUN: 11 mg/dL (ref 6–20)
CO2: 21 mmol/L — ABNORMAL LOW (ref 22–32)
Calcium: 8.3 mg/dL — ABNORMAL LOW (ref 8.9–10.3)
Chloride: 103 mmol/L (ref 98–111)
Creatinine, Ser: 1.21 mg/dL — ABNORMAL HIGH (ref 0.44–1.00)
GFR calc Af Amer: >60 mL/min (ref >60)
GFR calc non Af Amer: >60 mL/min (ref >60)
Glucose, Bld: 108 mg/dL — ABNORMAL HIGH (ref 70–99)
Potassium: 3.6 mmol/L (ref 3.5–5.1)
Sodium: 136 mmol/L (ref 135–145)
Total Bilirubin: 0.6 mg/dL (ref 0.3–1.2)
Total Protein: 5.2 g/dL — ABNORMAL LOW (ref 6.5–8.1)

## 2017-12-10 LAB — CREATININE, SERUM
Creatinine, Ser: 1.26 mg/dL — ABNORMAL HIGH (ref 0.44–1.00)
GFR calc Af Amer: >60 mL/min (ref >60)
GFR calc non Af Amer: >60 mL/min (ref >60)

## 2017-12-10 LAB — PROTEIN / CREATININE RATIO, URINE
Creatinine, Urine: 54 mg/dL
Protein Creatinine Ratio: 0.85 mg/mg{Cre} — ABNORMAL HIGH (ref 0.00–0.15)
Total Protein, Urine: 46 mg/dL

## 2017-12-10 LAB — URIC ACID: Uric Acid, Serum: 6.1 mg/dL (ref 2.5–7.1)

## 2017-12-10 LAB — LACTATE DEHYDROGENASE: LDH: 236 U/L — ABNORMAL HIGH (ref 98–192)

## 2017-12-10 SURGERY — Surgical Case
Anesthesia: Epidural | Wound class: Clean Contaminated

## 2017-12-10 MED ORDER — OXYTOCIN 10 UNIT/ML IJ SOLN
INTRAMUSCULAR | Status: AC
  Filled 2017-12-10: qty 4

## 2017-12-10 MED ORDER — SCOPOLAMINE 1 MG/3DAYS TD PT72: 1.0000 | MEDICATED_PATCH | Freq: Once | TRANSDERMAL | Status: DC

## 2017-12-10 MED ORDER — LIDOCAINE-EPINEPHRINE (PF) 2 %-1:200000 IJ SOLN
INTRAMUSCULAR | Status: AC
  Filled 2017-12-10: qty 20

## 2017-12-10 MED ORDER — NALBUPHINE HCL 10 MG/ML IJ SOLN
5.0000 mg | Freq: Once | INTRAMUSCULAR | Status: AC | PRN
  Administered 2017-12-10: 5 mg via SUBCUTANEOUS

## 2017-12-10 MED ORDER — SIMETHICONE 80 MG PO CHEW
80.0000 mg | CHEWABLE_TABLET | ORAL | Status: DC
  Administered 2017-12-10 – 2017-12-13 (×4): 80 mg via ORAL
  Filled 2017-12-10 (×3): qty 1

## 2017-12-10 MED ORDER — NALOXONE HCL 4 MG/10ML IJ SOLN
1.0000 ug/kg/h | INTRAVENOUS | Status: DC | PRN
  Filled 2017-12-10: qty 5

## 2017-12-10 MED ORDER — COCONUT OIL OIL
1.0000 "application " | TOPICAL_OIL | Status: DC | PRN
  Administered 2017-12-12: 1 via TOPICAL
  Filled 2017-12-10: qty 120

## 2017-12-10 MED ORDER — ENOXAPARIN SODIUM 40 MG/0.4ML ~~LOC~~ SOLN
40.0000 mg | SUBCUTANEOUS | Status: DC
  Administered 2017-12-11 – 2017-12-13 (×3): 40 mg via SUBCUTANEOUS
  Filled 2017-12-10 (×3): qty 0.4

## 2017-12-10 MED ORDER — ACETAMINOPHEN 325 MG PO TABS
650.0000 mg | ORAL_TABLET | Freq: Four times a day (QID) | ORAL | Status: DC | PRN
  Administered 2017-12-10 – 2017-12-13 (×5): 650 mg via ORAL
  Filled 2017-12-10 (×5): qty 2

## 2017-12-10 MED ORDER — LACTATED RINGERS IV SOLN
INTRAVENOUS | Status: DC
  Administered 2017-12-10: 20:00:00 via INTRAVENOUS

## 2017-12-10 MED ORDER — LIDOCAINE-EPINEPHRINE (PF) 2 %-1:200000 IJ SOLN
INTRAMUSCULAR | Status: DC | PRN
  Administered 2017-12-10: 4 mL via EPIDURAL
  Administered 2017-12-10 (×3): 5 mL via EPIDURAL

## 2017-12-10 MED ORDER — SENNOSIDES-DOCUSATE SODIUM 8.6-50 MG PO TABS
2.0000 | ORAL_TABLET | ORAL | Status: DC
  Administered 2017-12-10 – 2017-12-13 (×3): 2 via ORAL
  Filled 2017-12-10 (×3): qty 2

## 2017-12-10 MED ORDER — OXYCODONE HCL 5 MG PO TABS
5.0000 mg | ORAL_TABLET | Freq: Four times a day (QID) | ORAL | Status: DC | PRN
  Administered 2017-12-11 – 2017-12-13 (×5): 5 mg via ORAL
  Filled 2017-12-10 (×5): qty 1

## 2017-12-10 MED ORDER — TETANUS-DIPHTH-ACELL PERTUSSIS 5-2.5-18.5 LF-MCG/0.5 IM SUSP: 0.5000 mL | Freq: Once | INTRAMUSCULAR | Status: DC

## 2017-12-10 MED ORDER — DEXAMETHASONE SODIUM PHOSPHATE 4 MG/ML IJ SOLN
INTRAMUSCULAR | Status: DC | PRN
  Administered 2017-12-10: 4 mg via INTRAVENOUS

## 2017-12-10 MED ORDER — IBUPROFEN 600 MG PO TABS
600.0000 mg | ORAL_TABLET | Freq: Four times a day (QID) | ORAL | Status: DC | PRN
  Administered 2017-12-10 – 2017-12-13 (×6): 600 mg via ORAL
  Filled 2017-12-10 (×6): qty 1

## 2017-12-10 MED ORDER — DIPHENHYDRAMINE HCL 25 MG PO CAPS
25.0000 mg | ORAL_CAPSULE | ORAL | Status: DC | PRN
  Filled 2017-12-10: qty 1

## 2017-12-10 MED ORDER — METHYLERGONOVINE MALEATE 0.2 MG/ML IJ SOLN: 0.2000 mg | INTRAMUSCULAR | Status: DC | PRN

## 2017-12-10 MED ORDER — NALBUPHINE HCL 10 MG/ML IJ SOLN: 5.0000 mg | Freq: Once | INTRAMUSCULAR | Status: AC | PRN

## 2017-12-10 MED ORDER — SODIUM CHLORIDE 0.9% FLUSH: 3.0000 mL | INTRAVENOUS | Status: DC | PRN

## 2017-12-10 MED ORDER — SIMETHICONE 80 MG PO CHEW: 80.0000 mg | CHEWABLE_TABLET | ORAL | Status: DC | PRN

## 2017-12-10 MED ORDER — PROMETHAZINE HCL 25 MG/ML IJ SOLN: 6.2500 mg | INTRAMUSCULAR | Status: DC | PRN

## 2017-12-10 MED ORDER — MENTHOL 3 MG MT LOZG: 1.0000 | LOZENGE | OROMUCOSAL | Status: DC | PRN

## 2017-12-10 MED ORDER — MORPHINE SULFATE (PF) 0.5 MG/ML IJ SOLN
INTRAMUSCULAR | Status: DC | PRN
  Administered 2017-12-10: 3 mg via EPIDURAL

## 2017-12-10 MED ORDER — OXYTOCIN 40 UNITS IN LACTATED RINGERS INFUSION - SIMPLE MED: 2.5000 [IU]/h | INTRAVENOUS | Status: AC

## 2017-12-10 MED ORDER — OXYCODONE HCL 5 MG/5ML PO SOLN: 5.0000 mg | Freq: Once | ORAL | Status: DC | PRN

## 2017-12-10 MED ORDER — PRENATAL MULTIVITAMIN CH
1.0000 | ORAL_TABLET | Freq: Every day | ORAL | Status: DC
  Administered 2017-12-11 – 2017-12-13 (×3): 1 via ORAL
  Filled 2017-12-10 (×3): qty 1

## 2017-12-10 MED ORDER — DIPHENHYDRAMINE HCL 25 MG PO CAPS: 25.0000 mg | ORAL_CAPSULE | Freq: Four times a day (QID) | ORAL | Status: DC | PRN

## 2017-12-10 MED ORDER — METHYLERGONOVINE MALEATE 0.2 MG/ML IJ SOLN
INTRAMUSCULAR | Status: DC | PRN
  Administered 2017-12-10: 0.2 mg via INTRAMUSCULAR

## 2017-12-10 MED ORDER — CEFAZOLIN SODIUM-DEXTROSE 2-4 GM/100ML-% IV SOLN
INTRAVENOUS | Status: AC
  Filled 2017-12-10: qty 100

## 2017-12-10 MED ORDER — ONDANSETRON HCL 4 MG/2ML IJ SOLN
INTRAMUSCULAR | Status: AC
  Filled 2017-12-10: qty 4

## 2017-12-10 MED ORDER — SIMETHICONE 80 MG PO CHEW
80.0000 mg | CHEWABLE_TABLET | Freq: Three times a day (TID) | ORAL | Status: DC
  Administered 2017-12-10 – 2017-12-12 (×6): 80 mg via ORAL
  Filled 2017-12-10 (×8): qty 1

## 2017-12-10 MED ORDER — HYDROMORPHONE HCL 1 MG/ML IJ SOLN: 0.2500 mg | INTRAMUSCULAR | Status: DC | PRN

## 2017-12-10 MED ORDER — MEPERIDINE HCL 25 MG/ML IJ SOLN: 6.2500 mg | INTRAMUSCULAR | Status: DC | PRN

## 2017-12-10 MED ORDER — ONDANSETRON HCL 4 MG/2ML IJ SOLN
INTRAMUSCULAR | Status: DC | PRN
  Administered 2017-12-10: 4 mg via INTRAVENOUS

## 2017-12-10 MED ORDER — CEFAZOLIN SODIUM-DEXTROSE 2-4 GM/100ML-% IV SOLN
2.0000 g | INTRAVENOUS | Status: DC
  Administered 2017-12-10: 2 g via INTRAVENOUS

## 2017-12-10 MED ORDER — NALBUPHINE HCL 10 MG/ML IJ SOLN: 5.0000 mg | INTRAMUSCULAR | Status: DC | PRN

## 2017-12-10 MED ORDER — FENTANYL CITRATE (PF) 100 MCG/2ML IJ SOLN
INTRAMUSCULAR | Status: AC
  Filled 2017-12-10: qty 2

## 2017-12-10 MED ORDER — OXYTOCIN 10 UNIT/ML IJ SOLN
INTRAVENOUS | Status: DC | PRN
  Administered 2017-12-10: 40 [IU] via INTRAVENOUS

## 2017-12-10 MED ORDER — SODIUM CHLORIDE 0.9 % IR SOLN
Status: DC | PRN
  Administered 2017-12-10: 900 mL

## 2017-12-10 MED ORDER — WITCH HAZEL-GLYCERIN EX PADS: 1.0000 "application " | MEDICATED_PAD | CUTANEOUS | Status: DC | PRN

## 2017-12-10 MED ORDER — LACTATED RINGERS IV SOLN
INTRAVENOUS | Status: DC | PRN
  Administered 2017-12-10 (×3): via INTRAVENOUS

## 2017-12-10 MED ORDER — METHYLERGONOVINE MALEATE 0.2 MG PO TABS: 0.2000 mg | ORAL_TABLET | ORAL | Status: DC | PRN

## 2017-12-10 MED ORDER — SODIUM CHLORIDE 0.9 % IV SOLN
3.0000 g | Freq: Four times a day (QID) | INTRAVENOUS | Status: DC
  Administered 2017-12-10: 3 g via INTRAVENOUS
  Filled 2017-12-10 (×4): qty 3

## 2017-12-10 MED ORDER — SODIUM CHLORIDE 0.9 % IV SOLN
3.0000 g | Freq: Four times a day (QID) | INTRAVENOUS | Status: AC
  Administered 2017-12-10 – 2017-12-11 (×4): 3 g via INTRAVENOUS
  Filled 2017-12-10 (×4): qty 3

## 2017-12-10 MED ORDER — DIBUCAINE 1 % RE OINT: 1.0000 "application " | TOPICAL_OINTMENT | RECTAL | Status: DC | PRN

## 2017-12-10 MED ORDER — METHYLERGONOVINE MALEATE 0.2 MG/ML IJ SOLN
INTRAMUSCULAR | Status: AC
  Filled 2017-12-10: qty 1

## 2017-12-10 MED ORDER — MORPHINE SULFATE (PF) 0.5 MG/ML IJ SOLN
INTRAMUSCULAR | Status: AC
  Filled 2017-12-10: qty 10

## 2017-12-10 MED ORDER — ONDANSETRON HCL 4 MG/2ML IJ SOLN: 4.0000 mg | Freq: Three times a day (TID) | INTRAMUSCULAR | Status: DC | PRN

## 2017-12-10 MED ORDER — NALOXONE HCL 0.4 MG/ML IJ SOLN: 0.4000 mg | INTRAMUSCULAR | Status: DC | PRN

## 2017-12-10 MED ORDER — KETOROLAC TROMETHAMINE 30 MG/ML IJ SOLN: 30.0000 mg | Freq: Once | INTRAMUSCULAR | Status: DC | PRN

## 2017-12-10 MED ORDER — ZOLPIDEM TARTRATE 5 MG PO TABS: 5.0000 mg | ORAL_TABLET | Freq: Every evening | ORAL | Status: DC | PRN

## 2017-12-10 MED ORDER — SODIUM BICARBONATE 8.4 % IV SOLN
INTRAVENOUS | Status: AC
  Filled 2017-12-10: qty 50

## 2017-12-10 MED ORDER — NALBUPHINE HCL 10 MG/ML IJ SOLN
5.0000 mg | INTRAMUSCULAR | Status: DC | PRN
  Filled 2017-12-10: qty 1

## 2017-12-10 MED ORDER — DIPHENHYDRAMINE HCL 50 MG/ML IJ SOLN: 12.5000 mg | INTRAMUSCULAR | Status: DC | PRN

## 2017-12-10 MED ORDER — OXYCODONE HCL 5 MG PO TABS: 5.0000 mg | ORAL_TABLET | Freq: Once | ORAL | Status: DC | PRN

## 2017-12-10 SURGICAL SUPPLY — 36 items
BENZOIN TINCTURE PRP APPL 2/3 (GAUZE/BANDAGES/DRESSINGS) ×2 IMPLANT
CHLORAPREP W/TINT 26ML (MISCELLANEOUS) ×2 IMPLANT
CLAMP CORD UMBIL (MISCELLANEOUS) IMPLANT
CLOTH BEACON ORANGE TIMEOUT ST (SAFETY) ×2 IMPLANT
DRAIN JACKSON PRT FLT 10 (DRAIN) IMPLANT
DRSG OPSITE POSTOP 4X10 (GAUZE/BANDAGES/DRESSINGS) ×2 IMPLANT
ELECT REM PT RETURN 9FT ADLT (ELECTROSURGICAL) ×2
ELECTRODE REM PT RTRN 9FT ADLT (ELECTROSURGICAL) ×1 IMPLANT
EVACUATOR SILICONE 100CC (DRAIN) IMPLANT
EXTRACTOR VACUUM M CUP 4 TUBE (SUCTIONS) ×2 IMPLANT
GLOVE BIO SURGEON STRL SZ 6.5 (GLOVE) ×2 IMPLANT
GLOVE BIOGEL PI IND STRL 7.0 (GLOVE) ×2 IMPLANT
GLOVE BIOGEL PI INDICATOR 7.0 (GLOVE) ×2
GOWN STRL REUS W/TWL LRG LVL3 (GOWN DISPOSABLE) ×4 IMPLANT
KIT ABG SYR 3ML LUER SLIP (SYRINGE) ×4 IMPLANT
NEEDLE HYPO 25X5/8 SAFETYGLIDE (NEEDLE) ×4 IMPLANT
NS IRRIG 1000ML POUR BTL (IV SOLUTION) ×2 IMPLANT
PACK C SECTION WH (CUSTOM PROCEDURE TRAY) ×2 IMPLANT
PAD OB MATERNITY 4.3X12.25 (PERSONAL CARE ITEMS) ×2 IMPLANT
PENCIL SMOKE EVAC W/HOLSTER (ELECTROSURGICAL) ×2 IMPLANT
RTRCTR C-SECT PINK 25CM LRG (MISCELLANEOUS) ×2 IMPLANT
SPONGE LAP 18X18 RF (DISPOSABLE) ×6 IMPLANT
STRIP CLOSURE SKIN 1/2X4 (GAUZE/BANDAGES/DRESSINGS) ×2 IMPLANT
STRIP CLOSURE SKIN 1/4X4 (GAUZE/BANDAGES/DRESSINGS) ×2 IMPLANT
SUT CHROMIC 0 CT 1 (SUTURE) ×2 IMPLANT
SUT MNCRL AB 3-0 PS2 27 (SUTURE) ×2 IMPLANT
SUT PLAIN 2 0 (SUTURE) ×2
SUT PLAIN 2 0 XLH (SUTURE) ×2 IMPLANT
SUT PLAIN ABS 2-0 CT1 27XMFL (SUTURE) ×2 IMPLANT
SUT SILK 2 0 SH (SUTURE) IMPLANT
SUT VIC AB 0 CTX 36 (SUTURE) ×4
SUT VIC AB 0 CTX36XBRD ANBCTRL (SUTURE) ×4 IMPLANT
SUT VIC AB 2-0 SH 27 (SUTURE)
SUT VIC AB 2-0 SH 27XBRD (SUTURE) IMPLANT
TOWEL OR 17X24 6PK STRL BLUE (TOWEL DISPOSABLE) ×2 IMPLANT
TRAY FOLEY W/BAG SLVR 14FR LF (SET/KITS/TRAYS/PACK) ×2 IMPLANT

## 2017-12-10 NOTE — Progress Notes (Signed)
Pt is ready for CS BP 120/77   Pulse 98   Temp (!) 101.1 F (38.4 C) (Oral)   Resp 18   Ht 5\' 4"  (1.626 m)   Wt 95.8 kg   SpO2 93%   BMI 36.27 kg/m   Cat 2 Failure to decend and chorioamnionits Pt with a anterior lip since 0100 It was reducable at 5 am and pt pushed for two hours with no descent I received the pt at 7 am In report I was told pt decided against CS last eventin Will give Unasyn for chorioamnionits R&B of CS reviewed

## 2017-12-10 NOTE — Transfer of Care (Signed)
Immediate Anesthesia Transfer of Care Note  Patient: Sonya Hatfield  Procedure(s) Performed: CESAREAN SECTION (N/A )  Patient Location: PACU  Anesthesia Type:Epidural  Level of Consciousness: awake, alert  and oriented  Airway & Oxygen Therapy: Patient Spontanous Breathing  Post-op Assessment: Report given to RN and Post -op Vital signs reviewed and stable  Post vital signs: Reviewed and stable HR 98, RR 20, SaO2 98%, BP 129/69  Last Vitals:  Vitals Value Taken Time  BP 125/110 12/10/2017  9:45 AM  Temp    Pulse 95 12/10/2017  9:47 AM  Resp    SpO2 97 % 12/10/2017  9:47 AM  Vitals shown include unvalidated device data.  Last Pain:  Vitals:   12/10/17 0800  TempSrc:   PainSc: 7          Complications: No apparent anesthesia complications

## 2017-12-10 NOTE — Progress Notes (Signed)
Subjective:   Objective: BP 136/75 (BP Location: Right Arm)   Pulse 87   Temp 99.7 F (37.6 C) (Oral)   Resp 20   Ht 5\' 4"  (1.626 m)   Wt 95.8 kg   SpO2 93%   BMI 36.27 kg/m  I/O last 3 completed shifts: In: 1631.3 [I.V.:1631.3] Out: -  Total I/O In: -  Out: 200 [Urine:200]  FHT: Category 1 UC:   regular, every 3 minutes SVE:   Dilation: 10 Effacement (%): 100 Station: 0 Exam by:: Rogers RN Pitocin at 16 MVUs 200+  Assessment:   FHR Cat 1 Slow progress Complete - anterior lip can be reduced with ease  Plan:  Will start pushing May need additional pain relief   [place], MSN 12/10/2017, 7:25 AM

## 2017-12-10 NOTE — Lactation Note (Signed)
This note was copied from a baby's chart. Lactation Consultation Note  Patient Name: Sonya Hatfield XAJOI'N Date: 12/10/2017 Reason for consult: Initial assessment;Term;Primapara   P1 mom states infant sleepy and not waking to brestfeed. LC unwrapped infant, reviewed hand expression. Mom able to hand express well, 30ml expressed into colostrum container.  Mom   Mom has a weak right hand and has been latching in cradle hold with shallow latch.  LC used pillows for support and rolled blankets and taught mom to latch in football.    Infant latched well, wide gape, swallows heard with compression, excellent rhythmic jaw movements noted.  Mom does have shorter shafted nipples but are easily compressible.  LC encouraged mom to use a position where infant could have more head support when latching.  After feeding LC demonstrated spoon feeding and fed infant 66ml. Encouraged mom to call out for assistance with feeding back colostrum to infant if she collects more.  LC suggested cup feeding to mom if she collects again.  Containers left for her and instructions for storing given/feeding back given.  BF basics reviewed, lactation brochure given.  Reviewed 8-12 feeds in 24 hours and feeding cues.     Maternal Data Has patient been taught Hand Expression?: Yes Does the patient have breastfeeding experience prior to this delivery?: No  Feeding Feeding Type: Breast Fed  LATCH Score Latch: Grasps breast easily, tongue down, lips flanged, rhythmical sucking.  Audible Swallowing: A few with stimulation  Type of Nipple: Everted at rest and after stimulation(shorter shaft; easily compressible)  Comfort (Breast/Nipple): Soft / non-tender  Hold (Positioning): Assistance needed to correctly position infant at breast and maintain latch.  LATCH Score: 8  Interventions Interventions: Breast feeding basics reviewed;Assisted with latch;Skin to skin;Position options;Support pillows;Expressed  milk;Hand express;Breast massage  Lactation Tools Discussed/Used     Consult Status Consult Status: Follow-up Date: 12/11/17 Follow-up type: In-patient    [place] 12/10/2017, 7:45 PM

## 2017-12-10 NOTE — Progress Notes (Signed)
Notified Sonya Hatfield, CNM, of lab results that were previously ordered. Patient has no complaints of headaches, blurred vision, or epigastric pain. No clonus, +2 reflexes. Awaiting any further orders. Maxwell Caul, Leretha [place]

## 2017-12-10 NOTE — Progress Notes (Addendum)
Subjective: Postpartum Day 0: Cesarean Delivery Patient denies symptoms of preeclampsia: headache, epigastric pain, visual changes.   Objective: Vitals:   12/10/17 1035 12/10/17 1045 12/10/17 1145 12/10/17 1245  BP: (!) 141/91 (!) 148/92 (!) 143/95 136/74  Pulse:  79 85 77  Resp:  20 (!) 21 18  Temp:  99.7 F (37.6 C) 99.4 F (37.4 C) 98 F (36.7 C)  TempSrc:  Oral Oral Oral  SpO2:  100% 98% 99%  Weight:      Height:       Results for orders placed or performed during the hospital encounter of 12/08/17 (from the past 24 hour(s))  CBC     Status: Abnormal   Collection Time: 12/10/17 11:26 AM  Result Value Ref Range   WBC 15.1 (H) 4.0 - 10.5 K/uL   RBC 4.26 3.87 - 5.11 MIL/uL   Hemoglobin 11.4 (L) 12.0 - 15.0 g/dL   HCT 35.2 (L) 36.0 - 46.0 %   MCV 82.6 80.0 - 100.0 fL   MCH 26.8 26.0 - 34.0 pg   MCHC 32.4 30.0 - 36.0 g/dL   RDW 13.9 11.5 - 15.5 %   Platelets 144 (L) 150 - 400 K/uL   nRBC 0.0 0.0 - 0.2 %  Creatinine, serum     Status: Abnormal   Collection Time: 12/10/17 11:26 AM  Result Value Ref Range   Creatinine, Ser 1.26 (H) 0.44 - 1.00 mg/dL   GFR calc non Af Amer >60 >60 mL/min   GFR calc Af Amer >60 >60 mL/min  Comprehensive metabolic panel     Status: Abnormal   Collection Time: 12/10/17 11:31 AM  Result Value Ref Range   Sodium 136 135 - 145 mmol/L   Potassium 3.6 3.5 - 5.1 mmol/L   Chloride 103 98 - 111 mmol/L   CO2 21 (L) 22 - 32 mmol/L   Glucose, Bld 108 (H) 70 - 99 mg/dL   BUN 11 6 - 20 mg/dL   Creatinine, Ser 1.21 (H) 0.44 - 1.00 mg/dL   Calcium 8.3 (L) 8.9 - 10.3 mg/dL   Total Protein 5.2 (L) 6.5 - 8.1 g/dL   Albumin 2.2 (L) 3.5 - 5.0 g/dL   AST 22 15 - 41 U/L   ALT 13 0 - 44 U/L   Alkaline Phosphatase 237 (H) 38 - 126 U/L   Total Bilirubin 0.6 0.3 - 1.2 mg/dL   GFR calc non Af Amer >60 >60 mL/min   GFR calc Af Amer >60 >60 mL/min   Anion gap 12 5 - 15  Lactate dehydrogenase     Status: Abnormal   Collection Time: 12/10/17 11:31 AM  Result  Value Ref Range   LDH 236 (H) 98 - 192 U/L  Uric acid     Status: None   Collection Time: 12/10/17 11:31 AM  Result Value Ref Range   Uric Acid, Serum 6.1 2.5 - 7.1 mg/dL  Protein / creatinine ratio, urine     Status: Abnormal   Collection Time: 12/10/17 12:15 PM  Result Value Ref Range   Creatinine, Urine 54.00 mg/dL   Total Protein, Urine 46 mg/dL   Protein Creatinine Ratio 0.85 (H) 0.00 - 0.15 mg/mg[Cre]    Assessment/Plan: Status post Cesarean section. Doing well postoperatively.  Dr. Charlesetta Garibaldi made aware patient meets criteria for preeclampsia without severe features, no further orders received at this time.  Continue to monitor blood pressures at this time, will provide blood pressure medication as needed  Continue current care.  Sonya Hatfield  Yong Channel 12/10/2017, 1:54 PM

## 2017-12-10 NOTE — Anesthesia Postprocedure Evaluation (Signed)
Anesthesia Post Note  Patient: Donnamae [place]  Procedure(s) Performed: CESAREAN SECTION (N/A )     Patient location during evaluation: PACU Anesthesia Type: Epidural Level of consciousness: awake and alert Pain management: pain level controlled Vital Signs Assessment: post-procedure vital signs reviewed and stable Respiratory status: spontaneous breathing, nonlabored ventilation and respiratory function stable Cardiovascular status: blood pressure returned to baseline and stable Postop Assessment: no apparent nausea or vomiting Anesthetic complications: no    Last Vitals:  Vitals:   12/10/17 1035 12/10/17 1045  BP: (!) 141/91 (!) 148/92  Pulse:  79  Resp:  20  Temp:  37.6 C  SpO2:  100%    Last Pain:  Vitals:   12/10/17 1045  TempSrc: Oral  PainSc:    Pain Goal:                 Lynda Rainwater

## 2017-12-10 NOTE — Addendum Note (Signed)
Addendum  created 12/10/17 1332 by Hewitt Blade, CRNA   Sign clinical note

## 2017-12-10 NOTE — Anesthesia Postprocedure Evaluation (Signed)
Anesthesia Post Note  Patient: Sonya Hatfield  Procedure(s) Performed: CESAREAN SECTION (N/A )     Patient location during evaluation: Mother Baby Anesthesia Type: Epidural Level of consciousness: awake and alert Pain management: pain level controlled Vital Signs Assessment: post-procedure vital signs reviewed and stable Respiratory status: spontaneous breathing, nonlabored ventilation and respiratory function stable Cardiovascular status: stable Postop Assessment: no headache, no backache, able to ambulate, adequate PO intake, epidural receding, no apparent nausea or vomiting and patient able to bend at knees Anesthetic complications: no    Last Vitals:  Vitals:   12/10/17 1045 12/10/17 1145  BP: (!) 148/92 (!) 143/95  Pulse: 79 85  Resp: 20 (!) 21  Temp: 37.6 C 37.4 C  SpO2: 100% 98%    Last Pain:  Vitals:   12/10/17 1145  TempSrc: Oral  PainSc:    Pain Goal:                 [employer]

## 2017-12-10 NOTE — Progress Notes (Signed)
Subjective:  Pt received bolus of epidural several times.    Objective: BP 136/75 (BP Location: Right Arm)   Pulse 87   Temp 99.7 F (37.6 C) (Oral)   Resp 20   Ht 5\' 4"  (1.626 m)   Wt 95.8 kg   SpO2 93%   BMI 36.27 kg/m  I/O last 3 completed shifts: In: 1631.3 [I.V.:1631.3] Out: -  No intake/output data recorded.  FHT: Category 2 UC:   regular, every 3 minutes SVE:   9.5/100/-1 Pitocin at 10 MVUs 200+  Assessment:   Anterior lip Fetal Decel  Plan:  Position changes FHR: Cat 2    Adriella Essex M Jannett Schmall CNM, MSN 12/10/2017, 7:19 AM

## 2017-12-10 NOTE — Op Note (Signed)
Cesarean Section Procedure Note   Sonya Hatfield  12/10/2017  Indications: FAILED INDUCTION  CHORIOAMNIONITIS FAILURE TO PROGRESS  Pre-operative Diagnosis: primary cesarean section.   Post-operative Diagnosis: Same   Surgeon: Surgeon(s) and Role:    * Crawford Givens, MD - Primary   Assistants: Ranee Gosselin CNM   Anesthesia: epidural   Procedure Details:  The patient was seen in the Holding Room. The risks, benefits, complications, treatment options, and expected outcomes were discussed with the patient. The patient concurred with the proposed plan, giving informed consent. identified as [name] and the procedure verified as C-Section Delivery. A Time Out was held and the above information confirmed.  After induction of anesthesia, the patient was draped and prepped in the usual sterile manner. A transverse incision was made and carried down through the subcutaneous tissue to the fascia. Fascial incision was made in the midline and extended transversely. The fascia was separated from the underlying rectus muscle superiorly and inferiorly. The peritoneum was identified and entered. Peritoneal incision was extended longitudinally with good visualization of bowel and bladder. The utero-vesical peritoneal reflection was incised transversely and the bladder flap was bluntly freed from the lower uterine segment.  An alexsis retractor was placed in the abdomen.   A low transverse uterine incision was made. Delivered from cephalic, OP presentation was a  infant, with Apgar scores of 8 at one minute and 9 at five minutes. Cord ph was sent the umbilical cord was clamped and cut cord blood was obtained for evaluation. The placenta was removed Intact and appeared normal. The uterine outline, tubes and ovaries appeared normal}. The uterine incision was closed with running locked sutures of 0Vicryl. A second layer 0 vicrlyl was used to imbricate the uterine incision    Hemostasis was observed.  METHERGINE WAS GIVEN FOR UTERINE ATONY.   Lavage was carried out until clear. The alexsis was removed.  The peritoneum was closed with 0 chromic.  The muscles were examined and any bleeders were made hemostatic using bovie cautery device.   The fascia was then reapproximated with running sutures of 0 vicryl.  The subcutaneous tissue was reapproximated  With interrupted stitches using 2-0 plain gut. The subcuticular closure was performed using 3-8monocryl     Instrument, sponge, and needle counts were correct prior the abdominal closure and were correct at the conclusion of the case.    Findings: infant was delivered from [place] op presentation. The fluid was CLEAR BUT CLOUDY.  The uterus tubes and ovaries appeared normal.     Estimated Blood Loss: 266 ML   Total IV Fluids: 2373ml   Urine Output: 350CC OF clear urine  Specimens: PLACENTA TO PATHOLOGY  Complications: no complications  Disposition: PACU - hemodynamically stable.   Maternal Condition: stable   Baby condition / location:  Couplet care / Skin to Skin  Attending Attestation: I performed the procedure.   Signed: Surgeon(s): Crawford Givens, MD

## 2017-12-10 NOTE — Progress Notes (Signed)
Notified Ranee Gosselin, CNM of orthostatic vital signs. Patient has only had liquids so far and no solid food. Patient felt slightly dizzy; however, recovered quickly with no other symptoms. Stood at side of bed for a few minutes and sat back down in order for patient to order food. Discussed the importance of increasing ambulation and frequent ambulation when patient comfortable and not dizzy. No new orders obtained.  Maxwell Caul, Leretha Dykes [place]

## 2017-12-11 ENCOUNTER — Encounter (HOSPITAL_COMMUNITY): Payer: Self-pay | Admitting: Obstetrics and Gynecology

## 2017-12-11 LAB — CBC
HCT: 29.9 % — ABNORMAL LOW (ref 36.0–46.0)
Hemoglobin: 9.5 g/dL — ABNORMAL LOW (ref 12.0–15.0)
MCH: 26.4 pg (ref 26.0–34.0)
MCHC: 31.8 g/dL (ref 30.0–36.0)
MCV: 83.1 fL (ref 80.0–100.0)
Platelets: 130 10*3/uL — ABNORMAL LOW (ref 150–400)
RBC: 3.6 MIL/uL — ABNORMAL LOW (ref 3.87–5.11)
RDW: 13.9 % (ref 11.5–15.5)
WBC: 15.1 10*3/uL — ABNORMAL HIGH (ref 4.0–10.5)
nRBC: 0 % (ref 0.0–0.2)

## 2017-12-11 MED ORDER — FERROUS SULFATE 325 (65 FE) MG PO TABS
325.0000 mg | ORAL_TABLET | Freq: Every day | ORAL | Status: DC
  Administered 2017-12-12: 325 mg via ORAL
  Filled 2017-12-11: qty 1

## 2017-12-11 NOTE — Progress Notes (Signed)
Sonya Hatfield [id number] Postpartum Postoperative Day # 2  Sonya Hatfield, G1P0, [redacted]w[redacted]d, S/P primary LT Cesarean Section due to  Indications: FAILED INDUCTION  CHORIOAMNIONITIS FAILURE TO PROGRESS Per Dr Charlesetta Garibaldi, whom performed the surgery.   Subjective: Patient up ad lib, denies syncope or dizziness. Reports consuming regular diet without issues and denies N/V. Patient reports 0 bowel movement + passing flatus.  Denies issues with urination and reports bleeding is "lighter."  Patient is Breastfeeding and reports going well.  Desires undecided for postpartum contraception.  Pain is being appropriately managed with use of po pain meds. Pt was dx with postpartum preeclampsia without SF on POD#0 with elevated PCR 0.85 with I&O cath PP. Pt currently denies HA, vision changes nor RUQ pain. Asymptomatic anemia.    Objective: Patient Vitals for the past 24 hrs:  BP Temp Temp src Pulse Resp SpO2  12/12/17 0510 135/89 97.8 F (36.6 C) Oral 78 16 98 %  12/11/17 2120 120/62 98 F (36.7 C) Oral 77 16 -  12/11/17 1715 - (!) 97.5 F (36.4 C) Oral - - -  12/11/17 1420 99/75 98 F (36.7 C) Oral 72 16 -  12/11/17 0945 111/77 97.8 F (36.6 C) Oral 71 - 96 %     Physical Exam:  General: alert, cooperative, appears stated age and no distress Mood/Affect: Happy Lungs: clear to auscultation, no wheezes, rales or rhonchi, symmetric air entry.  Heart: normal rate, regular rhythm, normal S1, S2, no murmurs, rubs, clicks or gallops. Breast: breasts appear normal, no suspicious masses, no skin or nipple changes or axillary nodes. Abdomen:  + bowel sounds, soft, non-tender Incision: healing well, no significant drainage, no dehiscence, no significant erythema, Honeycomb dressing  Uterine Fundus: firm, involution -2 Lochia: appropriate Skin: Warm, Dry. DVT Evaluation: No evidence of DVT seen on physical exam. Negative Homan's sign. No [place] or calf tenderness. No significant calf/ankle edema. 2+Ptellar  DTR No clonus.   Labs: Recent Labs    12/10/17 1126 12/11/17 0514 12/11/17 2347  HGB 11.4* 9.5* 9.5*  HCT 35.2* 29.9* 30.0*  WBC 15.1* 15.1* 13.9*    CBG (last 3)  No results for input(s): GLUCAP in the last 72 hours.   I/O: I/O last 3 completed shifts: In: 3140 [P.O.:840; I.V.:2300] Out: 2591 [Urine:2325; Blood:266]   Assessment Postpartum Postoperative Day # 2. Sonya Hatfield, G1P0, [redacted]w[redacted]d, S/P Primary LT Cesarean Section due to  Indications: FAILED INDUCTION  CHORIOAMNIONITIS FAILURE TO PROGRESS.  Pt stable. -2 Involution. Bottle Feeding. Hemodynamically Stable. Post op HGB 9.5 Preeclampsia: pt dx with PP Preeclampsia on POD#0 with elevated BP 140/90s, PCR 0.85, Plat 130, creatinine 1.21, normal LFTs, uric acid 6.1, Not given mag, Current BP 99/75, asymptotic, repeat labs done now. Suspected triple I during labor with maternal elevated temp, tx with abx, afebrile since.   @2300  on 12/09 :Consulted with Dr Simona Huh about creatinine levels being elevated and pt on lovenox, Outcome =continue with lovenox, benefits out weights risk. Consulted about not being on mag with lab changes. Outcome = no magnesium, needed at this time due to elevated creatine and low to normotensive Bps. .   @12 /10 @0100  creatinine 0.88, LFT WNL , plat 187, hgb 9.5.   @12 /10 @0510  BP 135/89  Plan: Continue other mgmt as ordered VTE Prophylactics: SCD, ambulated as tolerates, lovenox 40mg  daily.  Anemia: Iron Pain control: Motrin/Tylenol/Narcotics PRN Suspected triple I: Afebrile, placenta patho report pending. Baby Female = out pt circ desired.  Education given regarding options for contraception, including barrier methods,  injectable contraception, IUD placement, oral contraceptives.  Plan for discharge tomorrow and Breastfeeding  Dr. Mancel Bale to be updated on patient status @ change of shift @ 0700.  Kaylina Cahue NP-C, CNM 12/12/2017, 5:51 AM

## 2017-12-11 NOTE — Progress Notes (Addendum)
Subjective: Postpartum Day 1: Cesarean Delivery Patient reports tolerating PO, + flatus and no problems voiding. Yesterday PIH labs were drawn postpartum as patient had some elevated pressures after surgery. Preeclampsia labs showed elevated PCR so patient was given diagnosis of preeclampsia without severe features. She denies symptoms of preeclampsia: headache, vision changes, epigastric pain. Mild asymptomatic anemia, will start on PO Iron QD.   Objective: Vitals:   12/10/17 1600 12/10/17 2013 12/11/17 0000 12/11/17 0433  BP:  119/78 121/67 104/62  Pulse:  80 88 61  Resp: 18 18 18 18   Temp: 98.7 F (37.1 C) 98.9 F (37.2 C) 98.8 F (37.1 C) 97.7 F (36.5 C)  TempSrc: Oral Oral Oral Oral  SpO2: 98% 99% 98% 98%  Weight:      Height:        Physical Exam:  General: alert and cooperative Lochia: appropriate Uterine Fundus: firm Incision: healing well, no significant drainage, no dehiscence, no significant erythema DVT Evaluation: No evidence of DVT seen on physical exam. Negative Homan's sign. No cords or calf tenderness. No significant calf/ankle edema.  Recent Labs    12/10/17 1126 12/11/17 0514  HGB 11.4* 9.5*  HCT 35.2* 29.9*    Assessment/Plan: Status post Cesarean section with diagnosis of preeclampsia without severe features. Doing well postoperatively.  Dr. Cletis Media aware patient has been given diagnosis of preeclampsia without severe features  Mild asymptomatic anemia, started on PO Iron QD Continue current care.  Marikay Alar 12/11/2017, 8:01 AM

## 2017-12-11 NOTE — Progress Notes (Deleted)
Subjective: Postpartum Day 0: Cesarean Delivery Patient denies symptoms of preeclampsia: headache, epigastric pain, visual changes.   Objective: Vitals:   12/11/17 0433 12/11/17 0945 12/11/17 1420 12/11/17 1715  BP: 104/62 111/77 99/75   Pulse: 61 71 72   Resp: 18  16   Temp: 97.7 F (36.5 C) 97.8 F (36.6 C) 98 F (36.7 C) (!) 97.5 F (36.4 C)  TempSrc: Oral Oral Oral Oral  SpO2: 98% 96%    Weight:      Height:       Results for orders placed or performed during the hospital encounter of 12/08/17 (from the past 24 hour(s))  CBC     Status: Abnormal   Collection Time: 12/11/17  5:14 AM  Result Value Ref Range   WBC 15.1 (H) 4.0 - 10.5 K/uL   RBC 3.60 (L) 3.87 - 5.11 MIL/uL   Hemoglobin 9.5 (L) 12.0 - 15.0 g/dL   HCT 29.9 (L) 36.0 - 46.0 %   MCV 83.1 80.0 - 100.0 fL   MCH 26.4 26.0 - 34.0 pg   MCHC 31.8 30.0 - 36.0 g/dL   RDW 13.9 11.5 - 15.5 %   Platelets 130 (L) 150 - 400 K/uL   nRBC 0.0 0.0 - 0.2 %    Assessment/Plan: Status post Cesarean section. Doing well postoperatively.  Dr. Charlesetta Garibaldi made aware patient meets criteria for preeclampsia without severe features, no further orders received at this time.  Continue to monitor blood pressures at this time, will provide blood pressure medication as needed  Continue current care.  Marikay Alar 12/11/2017, 7:04 PM

## 2017-12-12 ENCOUNTER — Other Ambulatory Visit: Payer: Self-pay

## 2017-12-12 LAB — COMPREHENSIVE METABOLIC PANEL
ALT: 11 U/L (ref 0–44)
AST: 15 U/L (ref 15–41)
Albumin: 2.2 g/dL — ABNORMAL LOW (ref 3.5–5.0)
Alkaline Phosphatase: 165 U/L — ABNORMAL HIGH (ref 38–126)
Anion gap: 11 (ref 5–15)
BUN: 20 mg/dL (ref 6–20)
CO2: 21 mmol/L — ABNORMAL LOW (ref 22–32)
Calcium: 7.9 mg/dL — ABNORMAL LOW (ref 8.9–10.3)
Chloride: 102 mmol/L (ref 98–111)
Creatinine, Ser: 0.88 mg/dL (ref 0.44–1.00)
GFR calc Af Amer: >60 mL/min (ref >60)
GFR calc non Af Amer: >60 mL/min (ref >60)
Glucose, Bld: 68 mg/dL — ABNORMAL LOW (ref 70–99)
Potassium: 3.6 mmol/L (ref 3.5–5.1)
Sodium: 134 mmol/L — ABNORMAL LOW (ref 135–145)
Total Bilirubin: 0.8 mg/dL (ref 0.3–1.2)
Total Protein: 5.5 g/dL — ABNORMAL LOW (ref 6.5–8.1)

## 2017-12-12 LAB — CBC WITH DIFFERENTIAL/PLATELET
Basophils Absolute: 0 10*3/uL (ref 0.0–0.1)
Basophils Relative: 0 %
Eosinophils Absolute: 0.1 10*3/uL (ref 0.0–0.5)
Eosinophils Relative: 1 %
HCT: 30 % — ABNORMAL LOW (ref 36.0–46.0)
Hemoglobin: 9.5 g/dL — ABNORMAL LOW (ref 12.0–15.0)
Lymphocytes Relative: 17 %
Lymphs Abs: 2.4 10*3/uL (ref 0.7–4.0)
MCH: 26.4 pg (ref 26.0–34.0)
MCHC: 31.7 g/dL (ref 30.0–36.0)
MCV: 83.3 fL (ref 80.0–100.0)
Monocytes Absolute: 0.6 10*3/uL (ref 0.1–1.0)
Monocytes Relative: 4 %
Neutro Abs: 10.8 10*3/uL — ABNORMAL HIGH (ref 1.7–7.7)
Neutrophils Relative %: 78 %
Platelets: 187 10*3/uL (ref 150–400)
RBC: 3.6 MIL/uL — ABNORMAL LOW (ref 3.87–5.11)
RDW: 13.9 % (ref 11.5–15.5)
WBC: 13.9 10*3/uL — ABNORMAL HIGH (ref 4.0–10.5)
nRBC: 0 % (ref 0.0–0.2)

## 2017-12-12 NOTE — Lactation Note (Signed)
This note was copied from a baby's chart. Lactation Consultation Note  Patient Name: Sonya Hatfield Date: 12/12/2017 Reason for consult: Follow-up assessment Mom reports feedings are going well and baby is cluster feeding.  Instructed to continue to feed with any feeding cue.  Discussed milk coming to volume and the prevention and treatment of engorgement.  Manual pump given with instructions.  Lactation outpatient services and support information reviewed and encouraged.  Maternal Data    Feeding Feeding Type: Breast Fed  LATCH Score                   Interventions    Lactation Tools Discussed/Used     Consult Status Consult Status: Complete Follow-up type: Call as needed    Ave Filter 12/12/2017, 8:59 AM

## 2017-12-13 MED ORDER — IBUPROFEN 600 MG PO TABS: 600.0000 mg | ORAL_TABLET | Freq: Four times a day (QID) | ORAL | 0 refills | Status: DC | PRN

## 2017-12-13 MED ORDER — FERROUS SULFATE 325 (65 FE) MG PO TABS: 325.0000 mg | ORAL_TABLET | Freq: Every day | ORAL | 3 refills | Status: DC

## 2017-12-13 MED ORDER — OXYCODONE HCL 5 MG PO TABS: 5.0000 mg | ORAL_TABLET | Freq: Four times a day (QID) | ORAL | 0 refills | Status: AC | PRN

## 2017-12-13 NOTE — Discharge Instructions (Signed)
Postpartum Care After Cesarean Delivery °The period of time right after you deliver your newborn is called the postpartum period. °What kind of medical care will I receive? °· You may continue to receive fluids and medicines through an IV tube inserted into one of your veins. °· You may have small, flexible tube (catheter) draining urine from your bladder into a bag outside of your body. The catheter will be removed as soon as possible. °· You may be given a squirt bottle to use when you go to the bathroom. You may use this until you are comfortable wiping as usual. To use the squirt bottle, follow these steps: °? Before you urinate, fill the squirt bottle with warm water. The water should be warm. Do not use hot water. °? After you urinate, while you are sitting on the toilet, use the squirt bottle to rinse the area around your urethra and vaginal opening. This rinses away any urine and blood. °? You may do this instead of wiping. As you start healing, you may use the squirt bottle before wiping yourself. Make sure to wipe gently. °? Fill the squirt bottle with clean water every time you use the bathroom. °· You will be given sanitary pads to wear. °· Your incision will be monitored to make sure it is healing properly. You will be told when it is safe for your stitches, staples, or skin adhesive tape to be removed. °What can I expect? °· You may not feel the need to urinate for several hours after delivery. °· You will have some soreness and pain in your abdomen. You may have a small amount of blood or clear fluid coming from your incision. °· If you are breastfeeding, you may have uterine contractions every time you breastfeed for up to several weeks postpartum. Uterine contractions help your uterus return to its normal size. °· It is normal to have vaginal bleeding (lochia) after delivery. The amount and appearance of lochia is often similar to a menstrual period in the first week after delivery. It will  gradually decrease over the next few weeks to a dry, yellow-brown discharge. For most women, lochia stops completely by 6-8 weeks after delivery. Vaginal bleeding can vary from woman to woman. °· Within the first few days after delivery, you may have breast engorgement. This is when your breasts feel heavy, full, and uncomfortable. Your breasts may also throb and feel hard, tightly stretched, warm, and tender. After this occurs, you may have milk leaking from your breasts. Your health care provider can help you relieve discomfort due to breast engorgement. Breast engorgement should go away within a few days. °· You may feel more sad or worried than normal due to hormonal changes after delivery. These feelings should not last more than a few days. If these feelings do not go away after several days, speak with your health care provider. °How should I care for myself? °· Tell your health care provider if you have pain or discomfort. °· Drink enough water to keep your urine clear or pale yellow. °· Wash your hands thoroughly with soap and water for at least 20 seconds after changing your sanitary pads or using the toilet, and before holding or feeding your baby. °· If you are not breastfeeding, avoid touching your breasts a lot. Doing this can make your breasts produce more milk. °· If you become weak or lightheaded, or you feel like you might faint, ask for help before: °? Getting out of bed. °? Showering. °·   Change your sanitary pads frequently. Watch for any changes in your flow, such as a sudden increase in volume, a change in color, or the passing of large blood clots. If you pass a blood clot from your vagina, save it to show to your health care provider. Do not flush blood clots down the toilet without having your health care provider look at them.  Make sure that all your vaccinations are up to date. This can help protect you and your baby from getting certain diseases. You may need to have immunizations done  before you leave the hospital.  If desired, talk with your health care provider about methods of family planning or birth control (contraception). How can I start bonding with my baby? Spending as much time as possible with your baby is very important. During this time, you and your baby can get to know each other and develop a bond. Having your baby stay with you in your room (rooming in) can give you time to get to know your baby. Rooming in can also help you become comfortable caring for your baby. Breastfeeding can also help you bond with your baby. How can I plan for returning home with my baby?  Make sure that you have a car seat installed in your vehicle. ? Your car seat should be checked by a certified car seat installer to make sure that it is installed safely. ? Make sure that your baby fits into the car seat safely.  Ask your health care provider any questions you have about caring for yourself or your baby. Make sure that you are able to contact your health care provider with any questions after leaving the hospital. This information is not intended to replace advice given to you by your health care provider. Make sure you discuss any questions you have with your health care provider. Document Released: 09/14/2011 Document Revised: 05/25/2015 Document Reviewed: 11/24/2014 Elsevier Interactive Patient Education  2018 [employer].   Preeclampsia and Eclampsia Preeclampsia is a serious condition that develops only during pregnancy. It is also called toxemia of pregnancy. This condition causes high blood pressure along with other symptoms, such as swelling and headaches. These symptoms may develop as the condition gets worse. Preeclampsia may occur at 20 weeks of pregnancy or later. Diagnosing and treating preeclampsia early is very important. If not treated early, it can cause serious problems for you and your baby. One problem it can lead to is eclampsia, which is a condition that  causes muscle jerking or shaking (convulsions or seizures) in the mother. Delivering your baby is the best treatment for preeclampsia or eclampsia. Preeclampsia and eclampsia symptoms usually go away after your baby is born. What are the causes? The cause of preeclampsia is not known. What increases the risk? The following risk factors make you more likely to develop preeclampsia:  Being pregnant for the first time.  Having had preeclampsia during a past pregnancy.  Having a family history of preeclampsia.  Having high blood pressure.  Being pregnant with twins or triplets.  Being 80 or older.  Being African-American.  Having kidney disease or diabetes.  Having medical conditions such as lupus or blood diseases.  Being very overweight (obese).  What are the signs or symptoms? The earliest signs of preeclampsia are:  High blood pressure.  Increased protein in your urine. Your health care provider will check for this at every visit before you give birth (prenatal visit).  Other symptoms that may develop as the condition  gets worse include:  Severe headaches.  Sudden weight gain.  Swelling of the hands, face, legs, and feet.  Nausea and vomiting.  Vision problems, such as blurred or double vision.  Numbness in the face, arms, legs, and feet.  Urinating less than usual.  Dizziness.  Slurred speech.  Abdominal pain, especially upper abdominal pain.  Convulsions or seizures.  Symptoms generally go away after giving birth. How is this diagnosed? There are no screening tests for preeclampsia. Your health care provider will ask you about symptoms and check for signs of preeclampsia during your prenatal visits. You may also have tests that include:  Urine tests.  Blood tests.  Checking your blood pressure.  Monitoring your babys heart rate.  Ultrasound.  How is this treated? You and your health care provider will determine the treatment approach that is  best for you. Treatment may include:  Having more frequent prenatal exams to check for signs of preeclampsia, if you have an increased risk for preeclampsia.  Bed rest.  Reducing how much salt (sodium) you eat.  Medicine to lower your blood pressure.  Staying in the hospital, if your condition is severe. There, treatment will focus on controlling your blood pressure and the amount of fluids in your body (fluid retention).  You may need to take medicine (magnesium sulfate) to prevent seizures. This medicine may be given as an injection or through an IV tube.  Delivering your baby early, if your condition gets worse. You may have your labor started with medicine (induced), or you may have a cesarean delivery.  Follow these instructions at home: Eating and drinking   Drink enough fluid to keep your urine clear or pale yellow.  Eat a healthy diet that is low in sodium. Do not add salt to your food. Check nutrition labels to see how much sodium a food or beverage contains.  Avoid caffeine. Lifestyle  Do not use any products that contain nicotine or tobacco, such as cigarettes and e-cigarettes. If you need help quitting, ask your health care provider.  Do not use alcohol or drugs.  Avoid stress as much as possible. Rest and get plenty of sleep. General instructions  Take over-the-counter and prescription medicines only as told by your health care provider.  When lying down, lie on your side. This keeps pressure off of your baby.  When sitting or lying down, raise (elevate) your feet. Try putting some pillows underneath your lower legs.  Exercise regularly. Ask your health care provider what kinds of exercise are best for you.  Keep all follow-up and prenatal visits as told by your health care provider. This is important. How is this prevented? To prevent preeclampsia or eclampsia from developing during another pregnancy:  Get proper medical care during pregnancy. Your health  care provider may be able to prevent preeclampsia or diagnose and treat it early.  Your health care provider may have you take a low-dose aspirin or a calcium supplement during your next pregnancy.  You may have tests of your blood pressure and kidney function after giving birth.  Maintain a healthy weight. Ask your health care provider for help managing weight gain during pregnancy.  Work with your health care provider to manage any long-term (chronic) health conditions you have, such as diabetes or kidney problems.  Contact a health care provider if:  You gain more weight than expected.  You have headaches.  You have nausea or vomiting.  You have abdominal pain.  You feel dizzy or light-headed.  Get help right away if:  You develop sudden or severe swelling anywhere in your body. This usually happens in the legs.  You gain 5 lbs (2.3 kg) or more during one week.  You have severe: ? Abdominal pain. ? Headaches. ? Dizziness. ? Vision problems. ? Confusion. ? Nausea or vomiting.  You have a seizure.  You have trouble moving any part of your body.  You develop numbness in any part of your body.  You have trouble speaking.  You have any abnormal bleeding.  You pass out. This information is not intended to replace advice given to you by your health care provider. Make sure you discuss any questions you have with your health care provider. Document Released: 12/18/1999 Document Revised: 08/18/2015 Document Reviewed: 07/27/2015 Elsevier Interactive Patient Education  2018 [employer].   Postpartum Depression and Baby Blues The postpartum period begins right after the birth of a baby. During this time, there is often a great amount of joy and excitement. It is also a time of many changes in the life of the parents. Regardless of how many times a mother gives birth, each child brings new challenges and dynamics to the family. It is not unusual to have feelings of  excitement along with confusing shifts in moods, emotions, and thoughts. All mothers are at risk of developing postpartum depression or the "baby blues." These mood changes can occur right after giving birth, or they may occur many months after giving birth. The baby blues or postpartum depression can be mild or severe. Additionally, postpartum depression can go away rather quickly, or it can be a long-term condition. What are the causes? Raised hormone levels and the rapid drop in those levels are thought to be a main cause of postpartum depression and the baby blues. A number of hormones change during and after pregnancy. Estrogen and progesterone usually decrease right after the delivery of your baby. The levels of thyroid hormone and various cortisol steroids also rapidly drop. Other factors that play a role in these mood changes include major life events and genetics. What increases the risk? If you have any of the following risks for the baby blues or postpartum depression, know what symptoms to watch out for during the postpartum period. Risk factors that may increase the likelihood of getting the baby blues or postpartum depression include:  Having a personal or family history of depression.  Having depression while being pregnant.  Having premenstrual mood issues or mood issues related to oral contraceptives.  Having a lot of life stress.  Having marital conflict.  Lacking a social support network.  Having a baby with special needs.  Having health problems, such as diabetes.  What are the signs or symptoms? Symptoms of baby blues include:  Brief changes in mood, such as going from extreme happiness to sadness.  Decreased concentration.  Difficulty sleeping.  Crying spells, tearfulness.  Irritability.  Anxiety.  Symptoms of postpartum depression typically begin within the first month after giving birth. These symptoms include:  Difficulty sleeping or excessive  sleepiness.  Marked weight loss.  Agitation.  Feelings of worthlessness.  Lack of interest in activity or food.  Postpartum psychosis is a very serious condition and can be dangerous. Fortunately, it is rare. Displaying any of the following symptoms is cause for immediate medical attention. Symptoms of postpartum psychosis include:  Hallucinations and delusions.  Bizarre or disorganized behavior.  Confusion or disorientation.  How is this diagnosed? A diagnosis is made by an evaluation  of your symptoms. There are no medical or lab tests that lead to a diagnosis, but there are various questionnaires that a health care provider may use to identify those with the baby blues, postpartum depression, or psychosis. Often, a screening tool called the [place] Postnatal Depression Scale is used to diagnose depression in the postpartum period. How is this treated? The baby blues usually goes away on its own in 1-2 weeks. Social support is often all that is needed. You will be encouraged to get adequate sleep and rest. Occasionally, you may be given medicines to help you sleep. Postpartum depression requires treatment because it can last several months or longer if it is not treated. Treatment may include individual or group therapy, medicine, or both to address any social, physiological, and psychological factors that may play a role in the depression. Regular exercise, a healthy diet, rest, and social support may also be strongly recommended. Postpartum psychosis is more serious and needs treatment right away. Hospitalization is often needed. Follow these instructions at home:  Get as much rest as you can. Nap when the baby sleeps.  Exercise regularly. Some women find yoga and walking to be beneficial.  Eat a balanced and nourishing diet.  Do little things that you enjoy. Have a cup of tea, take a bubble bath, read your favorite magazine, or listen to your favorite music.  Avoid  alcohol.  Ask for help with household chores, cooking, grocery shopping, or running errands as needed. Do not try to do everything.  Talk to people close to you about how you are feeling. Get support from your partner, family members, friends, or other new moms.  Try to stay positive in how you think. Think about the things you are grateful for.  Do not spend a lot of time alone.  Only take over-the-counter or prescription medicine as directed by your health care provider.  Keep all your postpartum appointments.  Let your health care provider know if you have any concerns. Contact a health care provider if: You are having a reaction to or problems with your medicine. Get help right away if:  You have suicidal feelings.  You think you may harm the baby or someone else. This information is not intended to replace advice given to you by your health care provider. Make sure you discuss any questions you have with your health care provider. Document Released: 09/24/2003 Document Revised: 05/28/2015 Document Reviewed: 10/01/2012 Elsevier Interactive Patient Education  2017 [employer].

## 2017-12-13 NOTE — Discharge Summary (Signed)
OB Discharge Summary     Patient Name: Sonya Hatfield DOB: 1995-08-03 MRN: [id number]  Date of admission: 12/08/2017 Delivering MD: Crawford Givens   Date of discharge: 12/13/2017  Admitting diagnosis: 41wks induction Intrauterine pregnancy: [redacted]w[redacted]d     Secondary diagnosis:  Active Problems:   Post-dates pregnancy   [redacted] weeks gestation of pregnancy   Preeclampsia     Discharge diagnosis: Term Pregnancy Delivered, Preeclampsia (mild) and Anemia                                                                                                Post partum procedures:n/a  Augmentation: AROM, Pitocin and Cytotec  Complications: None  Hospital course:  Induction of Labor With Cesarean Section  22 y.o. yo G1P0 at 103w5d was admitted to the hospital 12/08/2017 for induction of labor. Patient had a labor course significant for maternal fever treated with antibiotics. The patient went for cesarean section due to Arrest of Descent, and delivered a Viable infant,12/10/2017  Membrane Rupture Time/Date: 3:20 PM ,12/09/2017   Details of operation can be found in separate operative Note.  Patient had an uncomplicated postpartum course. She is ambulating, tolerating a regular diet, passing flatus, and urinating well.  Patient is discharged home in stable condition on 12/13/17.                                    Physical exam  Vitals:   12/12/17 0510 12/12/17 1324 12/12/17 2304 12/13/17 0518  BP: 135/89 105/82 131/88 114/65  Pulse: 78 92 89 68  Resp: 16  18 15   Temp: 97.8 F (36.6 C) 99 F (37.2 C) 97.7 F (36.5 C)   TempSrc: Oral Oral Oral   SpO2: 98% 97%  100%  Weight:      Height:       General: alert, cooperative and no distress Lochia: appropriate Uterine Fundus: firm Incision: Healing well with no significant drainage, No significant erythema, Dressing is clean, dry, and intact DVT Evaluation: No evidence of DVT seen on physical exam. Negative Homan's sign. No cords or calf  tenderness. No significant calf/ankle edema. Preeclampsia: denies symptoms: headache, visual changes, epigastric pain.   Labs: Lab Results  Component Value Date   WBC 13.9 (H) 12/11/2017   HGB 9.5 (L) 12/11/2017   HCT 30.0 (L) 12/11/2017   MCV 83.3 12/11/2017   PLT 187 12/11/2017   CMP Latest Ref Rng & Units 12/11/2017  Glucose 70 - 99 mg/dL 68(L)  BUN 6 - 20 mg/dL 20  Creatinine 0.44 - 1.00 mg/dL 0.88  Sodium 135 - 145 mmol/L 134(L)  Potassium 3.5 - 5.1 mmol/L 3.6  Chloride 98 - 111 mmol/L 102  CO2 22 - 32 mmol/L 21(L)  Calcium 8.9 - 10.3 mg/dL 7.9(L)  Total Protein 6.5 - 8.1 g/dL 5.5(L)  Total Bilirubin 0.3 - 1.2 mg/dL 0.8  Alkaline Phos 38 - 126 U/L 165(H)  AST 15 - 41 U/L 15  ALT 0 - 44 U/L 11    Discharge instruction: per After Visit  Summary and "Baby and Me Booklet". Preeclampsia precautions discussed and handout provided. Patient verbalized she would call or present to [facility] with any symptoms of preeclampsia.   After visit meds:  Allergies as of 12/13/2017   No Known Allergies     Medication List    TAKE these medications   ferrous sulfate 325 (65 FE) MG tablet Take 1 tablet (325 mg total) by mouth daily with breakfast.   ibuprofen 600 MG tablet Commonly known as:  ADVIL,MOTRIN Take 1 tablet (600 mg total) by mouth every 6 (six) hours as needed for moderate pain.   oxyCODONE 5 MG immediate release tablet Commonly known as:  Oxy IR/ROXICODONE Take 1 tablet (5 mg total) by mouth every 6 (six) hours as needed for up to 7 days for severe pain.       Diet: routine diet. Prescribed PO Iron QD for mild asymptomatic anemia.   Activity: Advance as tolerated. Pelvic rest for 6 weeks.   Outpatient follow up:1 week for incision check and blood pressure check, 6 weeks for postpartum visit. Baby love RN to see patient at home starting next week for blood pressure checks.  Follow up Appt:No future appointments. Follow up Visit:No follow-ups on file.  Postpartum  contraception: Undecided  Newborn Data: Live born female  Birth Weight: 7 lb 14.6 oz (3590 g) APGAR: 8, 9  Newborn Delivery   Birth date/time:  12/10/2017 08:52:00 Delivery type:  C-Section, Vacuum Assisted Trial of labor:  Yes C-section categorization:  Primary     Baby Feeding: Breast Disposition:home with mother   12/13/2017 Marikay Alar, CNM

## 2018-02-23 ENCOUNTER — Encounter (HOSPITAL_COMMUNITY): Payer: Self-pay

## 2019-06-20 ENCOUNTER — Ambulatory Visit (HOSPITAL_COMMUNITY)
Admission: EM | Admit: 2019-06-20 | Discharge: 2019-06-20 | Disposition: A | Discharge status: Home / Self Care | Payer: 59 | Attending: Emergency Medicine | Admitting: Emergency Medicine

## 2019-06-20 ENCOUNTER — Other Ambulatory Visit: Payer: Self-pay

## 2019-06-20 ENCOUNTER — Encounter (HOSPITAL_COMMUNITY): Payer: Self-pay

## 2019-06-20 DIAGNOSIS — T23222A Burn of second degree of single left finger (nail) except thumb, initial encounter: Secondary | ICD-10-CM

## 2019-06-20 MED ORDER — IBUPROFEN 800 MG PO TABS: 800.0000 mg | ORAL_TABLET | Freq: Three times a day (TID) | ORAL | 0 refills | Status: DC

## 2019-06-20 MED ORDER — CEPHALEXIN 500 MG PO CAPS: 500.0000 mg | ORAL_CAPSULE | Freq: Two times a day (BID) | ORAL | 0 refills | Status: AC

## 2019-06-20 MED ORDER — SILVER SULFADIAZINE 1 % EX CREA: 1.0000 "application " | TOPICAL_CREAM | Freq: Every day | CUTANEOUS | 0 refills | Status: DC

## 2019-06-20 NOTE — Discharge Instructions (Addendum)
Please use Tylenol [phone] mg every 4-6 hours, may supplement with Ibuprofen 600 mg every 8 hours May apply aloe vera for further relief/healing May apply wet gauze to keep cool Do not apply ice Keflex twice daily x 1 week Silvadene daily  Follow up for any concerns
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
## 2020-03-17 ENCOUNTER — Ambulatory Visit (HOSPITAL_COMMUNITY)
Admission: EM | Admit: 2020-03-17 | Discharge: 2020-03-17 | Disposition: A | Discharge status: Home / Self Care | Payer: 59 | Attending: Student | Admitting: Student

## 2020-03-17 ENCOUNTER — Encounter (HOSPITAL_COMMUNITY): Payer: Self-pay

## 2020-03-17 ENCOUNTER — Other Ambulatory Visit: Payer: Self-pay

## 2020-03-17 DIAGNOSIS — J029 Acute pharyngitis, unspecified: Secondary | ICD-10-CM

## 2020-03-17 DIAGNOSIS — J020 Streptococcal pharyngitis: Secondary | ICD-10-CM | POA: Diagnosis not present

## 2020-03-17 LAB — POCT RAPID STREP A, ED / UC: Streptococcus, Group A Screen (Direct): POSITIVE — AB

## 2020-03-17 MED ORDER — AMOXICILLIN-POT CLAVULANATE 875-125 MG PO TABS: 1.0000 | ORAL_TABLET | Freq: Two times a day (BID) | ORAL | 0 refills | Status: DC

## 2020-03-17 MED ORDER — ACETAMINOPHEN 325 MG PO TABS
650.0000 mg | ORAL_TABLET | Freq: Once | ORAL | Status: AC
  Administered 2020-03-17: 650 mg via ORAL

## 2020-03-17 MED ORDER — ACETAMINOPHEN 325 MG PO TABS
ORAL_TABLET | ORAL | Status: AC
  Filled 2020-03-17: qty 2

## 2020-03-17 MED ORDER — PREDNISONE 20 MG PO TABS: 20.0000 mg | ORAL_TABLET | Freq: Every day | ORAL | 0 refills | Status: AC

## 2020-03-17 NOTE — ED Notes (Signed)
Called pt, no answer.

## 2020-03-17 NOTE — Discharge Instructions (Addendum)
-  Start the augmentin (amoxicillin-clavulanate), twice daily for 7 days. You can take this with or without food, but if you have a sensitive stomach, you should take it with food.  -Strep is contagious until 24 hours after you start antibiotic treatment. -I also sent a steroid-prednisone (Deltasone), 1 pill taken in the morning for 5 days.  This can give you energy, so I recommend taking it early in the day.  This will help relieve the inflammation and pain in your throat faster than the antibiotic alone will. -If you experience worsening of symptoms despite treatment, like shortness of breath, fever/chills are not reduced by Tylenol, chest pain, intractable nausea/diarrhea-seek additional medical treatment.

## 2020-03-17 NOTE — ED Triage Notes (Signed)
Pt presents with sore throat, weakness, chills X 2 days.  Pt states she has taken DayQuil.
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
## 2020-05-15 ENCOUNTER — Encounter (HOSPITAL_COMMUNITY): Payer: Self-pay

## 2020-05-15 ENCOUNTER — Ambulatory Visit (HOSPITAL_COMMUNITY)
Admission: EM | Admit: 2020-05-15 | Discharge: 2020-05-15 | Disposition: A | Discharge status: Home / Self Care | Payer: 59 | Attending: Family Medicine | Admitting: Family Medicine

## 2020-05-15 DIAGNOSIS — J069 Acute upper respiratory infection, unspecified: Secondary | ICD-10-CM | POA: Insufficient documentation

## 2020-05-15 DIAGNOSIS — Z20822 Contact with and (suspected) exposure to covid-19: Secondary | ICD-10-CM | POA: Insufficient documentation

## 2020-05-15 LAB — POCT RAPID STREP A, ED / UC: Streptococcus, Group A Screen (Direct): NEGATIVE

## 2020-05-15 MED ORDER — LIDOCAINE VISCOUS HCL 2 % MT SOLN: 10.0000 mL | OROMUCOSAL | 0 refills | Status: DC | PRN
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
## 2020-05-15 NOTE — ED Triage Notes (Signed)
Pt in with c/o st that started yesterday  Pt drank alka seltzer tea for relief

## 2020-05-16 LAB — CULTURE, GROUP A STREP (THRC)

## 2020-05-16 LAB — SARS CORONAVIRUS 2 (TAT 6-24 HRS): SARS Coronavirus 2: NEGATIVE

## 2020-05-17 LAB — CULTURE, GROUP A STREP (THRC)

## 2020-05-18 ENCOUNTER — Telehealth (HOSPITAL_COMMUNITY): Payer: Self-pay | Admitting: Emergency Medicine

## 2020-05-18 MED ORDER — PENICILLIN V POTASSIUM 500 MG PO TABS: 500.0000 mg | ORAL_TABLET | Freq: Two times a day (BID) | ORAL | 0 refills | Status: DC

## 2020-05-18 MED ORDER — FLUCONAZOLE 150 MG PO TABS: 150.0000 mg | ORAL_TABLET | Freq: Once | ORAL | 0 refills | Status: AC

## 2020-05-18 MED ORDER — PENICILLIN V POTASSIUM 500 MG PO TABS: 500.0000 mg | ORAL_TABLET | Freq: Two times a day (BID) | ORAL | 0 refills | Status: AC

## 2020-05-18 NOTE — Telephone Encounter (Signed)
Patient asked for prescription to be sent to a different pharmacy.  Also requesting Diflucan, will send per protocol.

## 2020-06-17 ENCOUNTER — Emergency Department (HOSPITAL_BASED_OUTPATIENT_CLINIC_OR_DEPARTMENT_OTHER): Payer: 59 | Admitting: Radiology

## 2020-06-17 ENCOUNTER — Other Ambulatory Visit: Payer: Self-pay

## 2020-06-17 ENCOUNTER — Emergency Department (HOSPITAL_BASED_OUTPATIENT_CLINIC_OR_DEPARTMENT_OTHER)
Admission: EM | Admit: 2020-06-17 | Discharge: 2020-06-17 | Disposition: A | Discharge status: Home / Self Care | Payer: 59 | Attending: Emergency Medicine | Admitting: Emergency Medicine

## 2020-06-17 ENCOUNTER — Encounter (HOSPITAL_BASED_OUTPATIENT_CLINIC_OR_DEPARTMENT_OTHER): Payer: Self-pay

## 2020-06-17 DIAGNOSIS — R000 Tachycardia, unspecified: Secondary | ICD-10-CM | POA: Insufficient documentation

## 2020-06-17 DIAGNOSIS — M791 Myalgia, unspecified site: Secondary | ICD-10-CM | POA: Diagnosis not present

## 2020-06-17 DIAGNOSIS — R1110 Vomiting, unspecified: Secondary | ICD-10-CM | POA: Diagnosis not present

## 2020-06-17 DIAGNOSIS — R509 Fever, unspecified: Secondary | ICD-10-CM | POA: Diagnosis not present

## 2020-06-17 DIAGNOSIS — R059 Cough, unspecified: Secondary | ICD-10-CM | POA: Insufficient documentation

## 2020-06-17 DIAGNOSIS — Z20822 Contact with and (suspected) exposure to covid-19: Secondary | ICD-10-CM | POA: Diagnosis not present

## 2020-06-17 DIAGNOSIS — R0981 Nasal congestion: Secondary | ICD-10-CM | POA: Diagnosis not present

## 2020-06-17 DIAGNOSIS — R6889 Other general symptoms and signs: Secondary | ICD-10-CM

## 2020-06-17 LAB — RESP PANEL BY RT-PCR (FLU A&B, COVID) ARPGX2
Influenza A by PCR: POSITIVE — AB
Influenza B by PCR: NEGATIVE
SARS Coronavirus 2 by RT PCR: NEGATIVE

## 2020-06-17 MED ORDER — IBUPROFEN 100 MG/5ML PO SUSP
600.0000 mg | Freq: Once | ORAL | Status: DC
  Filled 2020-06-17: qty 30

## 2020-06-17 NOTE — Discharge Instructions (Addendum)
Follow-up viral testing results on MyChart. Use Tylenol every 4 hours and Motrin every 6 hours for body aches pain and fevers. Stay well-hydrated.  Return for worsening persistent shortness of breath or new concerns.
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
## 2020-06-17 NOTE — ED Triage Notes (Signed)
Pt reports one week history of cough, shortness of breath.  Reports took 2 Covid tests, both negative.  Reports fevers.  Has tried Mucinex and Tylenol.

## 2020-07-16 ENCOUNTER — Other Ambulatory Visit: Payer: Self-pay

## 2020-07-16 ENCOUNTER — Ambulatory Visit (INDEPENDENT_AMBULATORY_CARE_PROVIDER_SITE_OTHER): Payer: 59 | Admitting: Family Medicine

## 2020-07-16 ENCOUNTER — Encounter (INDEPENDENT_AMBULATORY_CARE_PROVIDER_SITE_OTHER): Payer: Self-pay | Admitting: Family Medicine

## 2020-07-16 VITALS — BP 106/61 | HR 68 | Temp 98.0°F | Ht 64.0 in | Wt 201.0 lb

## 2020-07-16 DIAGNOSIS — R0602 Shortness of breath: Secondary | ICD-10-CM

## 2020-07-16 DIAGNOSIS — Z6834 Body mass index (BMI) 34.0-34.9, adult: Secondary | ICD-10-CM

## 2020-07-16 DIAGNOSIS — D508 Other iron deficiency anemias: Secondary | ICD-10-CM

## 2020-07-16 DIAGNOSIS — Z9189 Other specified personal risk factors, not elsewhere classified: Secondary | ICD-10-CM

## 2020-07-16 DIAGNOSIS — Z1331 Encounter for screening for depression: Secondary | ICD-10-CM | POA: Diagnosis not present

## 2020-07-16 DIAGNOSIS — E559 Vitamin D deficiency, unspecified: Secondary | ICD-10-CM | POA: Diagnosis not present

## 2020-07-16 DIAGNOSIS — E669 Obesity, unspecified: Secondary | ICD-10-CM

## 2020-07-16 DIAGNOSIS — R5383 Other fatigue: Secondary | ICD-10-CM | POA: Diagnosis not present

## 2020-07-16 DIAGNOSIS — Z0289 Encounter for other administrative examinations: Secondary | ICD-10-CM

## 2020-07-17 LAB — CBC WITH DIFFERENTIAL/PLATELET
Basophils Absolute: 0 10*3/uL (ref 0.0–0.2)
Basos: 1 %
EOS (ABSOLUTE): 0.3 10*3/uL (ref 0.0–0.4)
Eos: 6 %
Hemoglobin: 13.4 g/dL (ref 11.1–15.9)
Immature Grans (Abs): 0 10*3/uL (ref 0.0–0.1)
Immature Granulocytes: 0 %
Lymphocytes Absolute: 2.2 10*3/uL (ref 0.7–3.1)
Lymphs: 46 %
MCH: 27.2 pg (ref 26.6–33.0)
MCHC: 32.4 g/dL (ref 31.5–35.7)
MCV: 84 fL (ref 79–97)
Monocytes Absolute: 0.5 10*3/uL (ref 0.1–0.9)
Monocytes: 11 %
Neutrophils Absolute: 1.7 10*3/uL (ref 1.4–7.0)
Neutrophils: 36 %
Platelets: 232 10*3/uL (ref 150–450)
RBC: 4.92 x10E6/uL (ref 3.77–5.28)
RDW: 13.8 % (ref 11.7–15.4)
WBC: 4.7 10*3/uL (ref 3.4–10.8)

## 2020-07-17 LAB — ANEMIA PANEL
Ferritin: 41 ng/mL (ref 15–150)
Folate, Hemolysate: 276 ng/mL
Folate, RBC: 667 ng/mL (ref >498)
Hematocrit: 41.4 % (ref 34.0–46.6)
Iron Saturation: 11 % — ABNORMAL LOW (ref 15–55)
Iron: 39 ug/dL (ref 27–159)
Retic Ct Pct: 1.4 % (ref 0.6–2.6)
Total Iron Binding Capacity: 347 ug/dL (ref 250–450)
UIBC: 308 ug/dL (ref 131–425)
Vitamin B-12: 587 pg/mL (ref 232–1245)

## 2020-07-17 LAB — COMPREHENSIVE METABOLIC PANEL
ALT: 16 IU/L (ref 0–32)
AST: 15 IU/L (ref 0–40)
Albumin/Globulin Ratio: 1.3 (ref 1.2–2.2)
Albumin: 4.3 g/dL (ref 3.9–5.0)
Alkaline Phosphatase: 70 IU/L (ref 44–121)
BUN/Creatinine Ratio: 14 (ref 9–23)
BUN: 11 mg/dL (ref 6–20)
Bilirubin Total: 0.3 mg/dL (ref 0.0–1.2)
CO2: 22 mmol/L (ref 20–29)
Calcium: 9.4 mg/dL (ref 8.7–10.2)
Chloride: 101 mmol/L (ref 96–106)
Creatinine, Ser: 0.79 mg/dL (ref 0.57–1.00)
Globulin, Total: 3.2 g/dL (ref 1.5–4.5)
Glucose: 80 mg/dL (ref 65–99)
Potassium: 4.4 mmol/L (ref 3.5–5.2)
Sodium: 139 mmol/L (ref 134–144)
Total Protein: 7.5 g/dL (ref 6.0–8.5)
eGFR: 107 mL/min/{1.73_m2} (ref >59)

## 2020-07-17 LAB — LIPID PANEL WITH LDL/HDL RATIO
Cholesterol, Total: 162 mg/dL (ref 100–199)
HDL: 64 mg/dL (ref >39)
LDL Chol Calc (NIH): 88 mg/dL (ref 0–99)
LDL/HDL Ratio: 1.4 ratio (ref 0.0–3.2)
Triglycerides: 47 mg/dL (ref 0–149)
VLDL Cholesterol Cal: 10 mg/dL (ref 5–40)

## 2020-07-17 LAB — FOLATE: Folate: 5.3 ng/mL (ref >3.0)

## 2020-07-17 LAB — VITAMIN D 25 HYDROXY (VIT D DEFICIENCY, FRACTURES): Vit D, 25-Hydroxy: 17.3 ng/mL — ABNORMAL LOW (ref 30.0–100.0)

## 2020-07-17 LAB — TSH: TSH: 3.92 u[IU]/mL (ref 0.450–4.500)

## 2020-07-17 LAB — T3: T3, Total: 133 ng/dL (ref 71–180)

## 2020-07-17 LAB — INSULIN, RANDOM: INSULIN: 3.8 u[IU]/mL (ref 2.6–24.9)

## 2020-07-17 LAB — T4: T4, Total: 7.8 ug/dL (ref 4.5–12.0)

## 2020-07-17 LAB — HEMOGLOBIN A1C
Est. average glucose Bld gHb Est-mCnc: 105 mg/dL
Hgb A1c MFr Bld: 5.3 % (ref 4.8–5.6)

## 2020-07-23 NOTE — Progress Notes (Signed)
Chief Complaint:   OBESITY Sonya Hatfield (MR# [id number]) is a 25 y.o. female who presents for evaluation and treatment of obesity and related comorbidities. Current BMI is Body mass index is 34.5 kg/m. Sonya Hatfield has been struggling with her weight for many years and has been unsuccessful in either losing weight, maintaining weight loss, or reaching her healthy weight goal.  Sonya Hatfield is currently in the action stage of change and ready to dedicate time achieving and maintaining a healthier weight. Sonya Hatfield is interested in becoming our patient and working on intensive lifestyle modifications including (but not limited to) diet and exercise for weight loss.  Sonya Hatfield heard about the clinic from her mother. She works at [employer] She is eating out 3-4 times a week, and eats at [employer] and [employer]. She is skipping breakfast and lunch daily. She works from home or office. 3-4 PM she has a bag of chips (individual) and [place] sandwich (3 slices) + 1 slice of cheese + mayo + mustard; supper 9-10 PM- chicken or pizza or sandwich.  Kamariah's habits were reviewed today and are as follows: Her family eats meals together, she thinks her family will eat healthier with her, her desired weight loss is 36 lbs, she started gaining weight after pregnancy, her heaviest weight ever was 207 pounds, she has significant food cravings issues, she skips meals frequently, she is frequently drinking liquids with calories, she frequently makes poor food choices, and she frequently eats larger portions than normal.  Depression Screen Sonya Hatfield's Food and Mood (modified PHQ-9) score was 7.  Depression screen [facility] 2/9 07/16/2020  Decreased Interest 3  Down, Depressed, Hopeless 1  PHQ - 2 Score 4  Altered sleeping 0  Tired, decreased energy 3  Change in appetite 0  Feeling bad or failure about yourself  0  Trouble concentrating 0  Moving slowly or fidgety/restless 0  Suicidal thoughts 0  PHQ-9 Score 7    Subjective:   1. Other fatigue Sonya Hatfield admits to daytime somnolence and admits to waking up still tired. Patent has a history of symptoms of daytime fatigue and morning fatigue. Sonya Hatfield generally gets  8+  hours of sleep per night, and states that she has generally restful sleep. Snoring is not present. Apneic episodes are not present. Epworth Sleepiness Score is 10. EKG normal sinus rhythm at 75 bpm.  2. Shortness of breath on exertion Hillary notes increasing shortness of breath with exercising and seems to be worsening over time with weight gain. She notes getting out of breath sooner with activity than she used to. This has gotten worse recently. Sonya Hatfield denies shortness of breath at rest or orthopnea. EKG normal sinus rhythm at 75 bpm.  3. Vitamin D deficiency Sonya Hatfield reports fatigue. She is not on a Vit D supplement.  4. Other iron deficiency anemia Prior labs showing anemia. Pt reports fatigue.  5. At risk for deficient intake of food Sonya Hatfield is at risk for deficient intake of food.  Assessment/Plan:   1. Other fatigue Sonya Hatfield does feel that her weight is causing her energy to be lower than it should be. Fatigue may be related to obesity, depression or many other causes. Labs will be ordered, and in the meanwhile, Shawntrice will focus on self care including making healthy food choices, increasing physical activity and focusing on stress reduction. Check labs today.  - EKG 12-Lead - Vitamin B12 - Comprehensive metabolic panel - Folate - Hemoglobin A1c - TSH - T4 - T3 - Insulin,  random  2. Shortness of breath on exertion Sonya Hatfield does feel that she gets out of breath more easily that she used to when she exercises. Zaidy's shortness of breath appears to be obesity related and exercise induced. She has agreed to work on weight loss and gradually increase exercise to treat her exercise induced shortness of breath. Will continue to monitor closely. Check labs today.  - Lipid Panel  With LDL/HDL Ratio  3. Vitamin D deficiency Low Vitamin D level contributes to fatigue and are associated with obesity, breast, and colon cancer. She agrees to follow-up for routine testing of Vitamin D, at least 2-3 times per year to avoid over-replacement. Check labs today.  - VITAMIN D 25 Hydroxy (Vit-D Deficiency, Fractures)  4. Other iron deficiency anemia Check labs today.  - CBC with Differential/Platelet - Anemia panel  5. Screening for depression Sonya Hatfield had a positive depression screening. Depression is commonly associated with obesity and often results in emotional eating behaviors. We will monitor this closely and work on CBT to help improve the non-hunger eating patterns. Referral to Psychology may be required if no improvement is seen as she continues in our clinic.  6. At risk for deficient intake of food Sonya Hatfield was given approximately 15 minutes of deficit intake of food prevention counseling today. Sonya Hatfield is at risk for eating too few calories based on current food recall. She was encouraged to focus on meeting caloric and protein goals according to her recommended meal plan.    7. Class 1 obesity with serious comorbidity and body mass index (BMI) of 34.0 to 34.9 in adult, unspecified obesity type  Sonya Hatfield is currently in the action stage of change and her goal is to continue with weight loss efforts. I recommend Sonya Hatfield begin the structured treatment plan as follows:  She has agreed to the Category 2 Plan + 100 calories.  Exercise goals: No exercise has been prescribed at this time.   Behavioral modification strategies: increasing lean protein intake, decreasing eating out, no skipping meals, and meal planning and cooking strategies.  She was informed of the importance of frequent follow-up visits to maximize her success with intensive lifestyle modifications for her multiple health conditions. She was informed we would discuss her lab results at her next visit unless  there is a critical issue that needs to be addressed sooner. Sonya Hatfield agreed to keep her next visit at the agreed upon time to discuss these results.  Objective:   Blood pressure 106/61, pulse 68, temperature 98 F (36.7 C), height 5\' 4"  (1.626 m), weight 201 lb (91.2 kg), SpO2 96 %. Body mass index is 34.5 kg/m.  EKG: Normal sinus rhythm, rate 75.  Indirect Calorimeter completed today shows a VO2 of ? and a REE of 1728.  Her calculated basal metabolic rate is 1497 thus her basal metabolic rate is worse than expected.  General: Cooperative, alert, well developed, in no acute distress. HEENT: Conjunctivae and lids unremarkable. Cardiovascular: Regular rhythm.  Lungs: Normal work of breathing. Neurologic: No focal deficits.   Lab Results  Component Value Date   CREATININE 0.79 07/16/2020   BUN 11 07/16/2020   NA 139 07/16/2020   K 4.4 07/16/2020   CL 101 07/16/2020   CO2 22 07/16/2020   Lab Results  Component Value Date   ALT 16 07/16/2020   AST 15 07/16/2020   ALKPHOS 70 07/16/2020   BILITOT 0.3 07/16/2020   Lab Results  Component Value Date   HGBA1C 5.3 07/16/2020   Lab  Results  Component Value Date   INSULIN 3.8 07/16/2020   Lab Results  Component Value Date   TSH 3.920 07/16/2020   Lab Results  Component Value Date   CHOL 162 07/16/2020   HDL 64 07/16/2020   LDLCALC 88 07/16/2020   TRIG 47 07/16/2020   Lab Results  Component Value Date   WBC 4.7 07/16/2020   HGB 13.4 07/16/2020   HCT 41.4 07/16/2020   MCV 84 07/16/2020   PLT 232 07/16/2020   Lab Results  Component Value Date   IRON 39 07/16/2020   TIBC 347 07/16/2020   FERRITIN 41 07/16/2020    Attestation Statements:   Reviewed by clinician on day of visit: allergies, medications, problem list, medical history, surgical history, family history, social history, and previous encounter notes.  Coral Ceo, CMA, am acting as transcriptionist for Coralie Common, MD.  This is the patient's  first visit at [facility]. The patient's NEW PATIENT PACKET was reviewed at length. Included in the packet: current and past health history, medications, allergies, ROS, gynecologic history (women only), surgical history, family history, social history, weight history, weight loss surgery history (for those that have had weight loss surgery), nutritional evaluation, mood and food questionnaire, PHQ9, Epworth questionnaire, sleep habits questionnaire, patient life and health improvement goals questionnaire. These will all be scanned into the patient's chart under media.   During the visit, I independently reviewed the patient's EKG, bioimpedance scale results, and indirect calorimeter results. I used this information to tailor a meal plan for the patient that will help her to lose weight and will improve her obesity-related conditions going forward. I performed a medically necessary appropriate examination and/or evaluation. I discussed the assessment and treatment plan with the patient. The patient was provided an opportunity to ask questions and all were answered. The patient agreed with the plan and demonstrated an understanding of the instructions. Labs were ordered at this visit and will be reviewed at the next visit unless more critical results need to be addressed immediately. Clinical information was updated and documented in the EMR.   Time spent on visit including pre-visit chart review and post-visit care was 45 minutes.   A separate 15 minutes was spent on risk counseling (see above).  I have reviewed the above documentation for accuracy and completeness, and I agree with the above. - Coralie Common, MD

## 2020-07-30 ENCOUNTER — Ambulatory Visit (INDEPENDENT_AMBULATORY_CARE_PROVIDER_SITE_OTHER): Payer: 59 | Admitting: Family Medicine

## 2020-07-30 ENCOUNTER — Encounter (INDEPENDENT_AMBULATORY_CARE_PROVIDER_SITE_OTHER): Payer: Self-pay | Admitting: Family Medicine

## 2020-07-30 ENCOUNTER — Other Ambulatory Visit: Payer: Self-pay

## 2020-07-30 VITALS — BP 110/60 | HR 86 | Temp 98.3°F | Ht 64.0 in | Wt 202.0 lb

## 2020-07-30 DIAGNOSIS — E669 Obesity, unspecified: Secondary | ICD-10-CM

## 2020-07-30 DIAGNOSIS — E559 Vitamin D deficiency, unspecified: Secondary | ICD-10-CM

## 2020-07-30 DIAGNOSIS — Z6834 Body mass index (BMI) 34.0-34.9, adult: Secondary | ICD-10-CM

## 2020-07-30 DIAGNOSIS — D508 Other iron deficiency anemias: Secondary | ICD-10-CM

## 2020-07-30 DIAGNOSIS — Z9189 Other specified personal risk factors, not elsewhere classified: Secondary | ICD-10-CM

## 2020-07-30 MED ORDER — VITAMIN D (ERGOCALCIFEROL) 1.25 MG (50000 UNIT) PO CAPS: 50000.0000 [IU] | ORAL_CAPSULE | ORAL | 0 refills | Status: DC

## 2020-08-03 NOTE — Progress Notes (Signed)
Chief Complaint:   OBESITY Sonya Hatfield is here to discuss her progress with her obesity treatment plan along with follow-up of her obesity related diagnoses. Sonya Hatfield is on the Category 2 Plan + 100 calories and states she is following her eating plan approximately 20% of the time. Sonya Hatfield states she is not currently exercising.  Today's visit was #: 2 Starting weight: 201 lbs Starting date: 07/16/2020 Today's weight: 202 lbs Today's date: 07/30/2020 Total lbs lost to date: 0 Total lbs lost since last in-office visit: 0  Interim History: Sonya Hatfield had a few days of following the meal plan, then struggled last week with stuff going on with her son. She is going to [place] tomorrow for 4 days. When she gets back from vacation, she realizes she needs to meal prep and plan.  Subjective:   1. Vitamin D deficiency History of diagnosis. Sonya Hatfield's Vit D level is 17.3. She reports fatigue. She is not on a Vit D supplement.   2. Other iron deficiency anemia Sonya Hatfield is not on a multivitamin. Her iron, ferritin, and CBC are within normal limits.  3. At risk for osteoporosis Sonya Hatfield is at higher risk of osteopenia and osteoporosis due to Vitamin D deficiency.   Assessment/Plan:   1. Vitamin D deficiency Low Vitamin D level contributes to fatigue and are associated with obesity, breast, and colon cancer. She agrees to start to take prescription Vitamin D '@50'$ ,000 IU every week and will follow-up for routine testing of Vitamin D, at least 2-3 times per year to avoid over-replacement.  Start- Vitamin D, Ergocalciferol, (DRISDOL) 1.25 MG (50000 UNIT) CAPS capsule; Take 1 capsule (50,000 Units total) by mouth every 7 (seven) days.  Dispense: 4 capsule; Refill: 0  2. Other iron deficiency anemia Recheck labs in 6 months.  3. At risk for osteoporosis Sonya Hatfield was given approximately 15 minutes of osteoporosis prevention counseling today. Sonya Hatfield is at risk for osteopenia and osteoporosis due to her Vitamin D  deficiency. She was encouraged to take her Vitamin D and follow her higher calcium diet and increase strengthening exercise to help strengthen her bones and decrease her risk of osteopenia and osteoporosis.  Repetitive spaced learning was employed today to elicit superior memory formation and behavioral change.  4. Obesity with current BMI of 34.7  Sonya Hatfield is currently in the action stage of change. As such, her goal is to continue with weight loss efforts. She has agreed to the Category 2 Plan + 100 calories.   Exercise goals: No exercise has been prescribed at this time.  Behavioral modification strategies: increasing lean protein intake, meal planning and cooking strategies, keeping healthy foods in the home, and planning for success.  Sonya Hatfield has agreed to follow-up with our clinic in 2-3 weeks. She was informed of the importance of frequent follow-up visits to maximize her success with intensive lifestyle modifications for her multiple health conditions.   Objective:   Blood pressure 110/60, pulse 86, temperature 98.3 F (36.8 C), height '5\' 4"'$  (1.626 m), weight 202 lb (91.6 kg), last menstrual period 07/30/2020, SpO2 97 %. Body mass index is 34.67 kg/m.  General: Cooperative, alert, well developed, in no acute distress. HEENT: Conjunctivae and lids unremarkable. Cardiovascular: Regular rhythm.  Lungs: Normal work of breathing. Neurologic: No focal deficits.   Lab Results  Component Value Date   CREATININE 0.79 07/16/2020   BUN 11 07/16/2020   NA 139 07/16/2020   K 4.4 07/16/2020   CL 101 07/16/2020   CO2 22 07/16/2020  Lab Results  Component Value Date   ALT 16 07/16/2020   AST 15 07/16/2020   ALKPHOS 70 07/16/2020   BILITOT 0.3 07/16/2020   Lab Results  Component Value Date   HGBA1C 5.3 07/16/2020   Lab Results  Component Value Date   INSULIN 3.8 07/16/2020   Lab Results  Component Value Date   TSH 3.920 07/16/2020   Lab Results  Component Value Date    CHOL 162 07/16/2020   HDL 64 07/16/2020   LDLCALC 88 07/16/2020   TRIG 47 07/16/2020   Lab Results  Component Value Date   VD25OH 17.3 (L) 07/16/2020   Lab Results  Component Value Date   WBC 4.7 07/16/2020   HGB 13.4 07/16/2020   HCT 41.4 07/16/2020   MCV 84 07/16/2020   PLT 232 07/16/2020   Lab Results  Component Value Date   IRON 39 07/16/2020   TIBC 347 07/16/2020   FERRITIN 41 07/16/2020   Attestation Statements:   Reviewed by clinician on day of visit: allergies, medications, problem list, medical history, surgical history, family history, social history, and previous encounter notes.  Coral Ceo, CMA, am acting as transcriptionist for Coralie Common, MD.  I have reviewed the above documentation for accuracy and completeness, and I agree with the above. - Coralie Common, MD

## 2020-08-06 ENCOUNTER — Encounter (INDEPENDENT_AMBULATORY_CARE_PROVIDER_SITE_OTHER): Payer: Self-pay

## 2020-08-06 ENCOUNTER — Encounter (INDEPENDENT_AMBULATORY_CARE_PROVIDER_SITE_OTHER): Payer: Self-pay | Admitting: Family Medicine

## 2020-08-12 ENCOUNTER — Ambulatory Visit (INDEPENDENT_AMBULATORY_CARE_PROVIDER_SITE_OTHER): Payer: 59 | Admitting: Family Medicine

## 2020-08-12 ENCOUNTER — Encounter (INDEPENDENT_AMBULATORY_CARE_PROVIDER_SITE_OTHER): Payer: Self-pay | Admitting: Family Medicine

## 2020-08-12 ENCOUNTER — Other Ambulatory Visit: Payer: Self-pay

## 2020-08-12 VITALS — BP 120/84 | HR 65 | Temp 98.4°F | Ht 64.0 in | Wt 201.0 lb

## 2020-08-12 DIAGNOSIS — Z6834 Body mass index (BMI) 34.0-34.9, adult: Secondary | ICD-10-CM

## 2020-08-12 DIAGNOSIS — E559 Vitamin D deficiency, unspecified: Secondary | ICD-10-CM | POA: Diagnosis not present

## 2020-08-12 DIAGNOSIS — E669 Obesity, unspecified: Secondary | ICD-10-CM | POA: Diagnosis not present

## 2020-08-12 DIAGNOSIS — Z9189 Other specified personal risk factors, not elsewhere classified: Secondary | ICD-10-CM | POA: Diagnosis not present

## 2020-08-13 ENCOUNTER — Ambulatory Visit (INDEPENDENT_AMBULATORY_CARE_PROVIDER_SITE_OTHER): Payer: 59 | Admitting: Family Medicine

## 2020-08-17 NOTE — Progress Notes (Signed)
Chief Complaint:   OBESITY Sonya Hatfield is here to discuss her progress with her obesity treatment plan along with follow-up of her obesity related diagnoses. Sonya Hatfield is on the Category 2 Plan + 100 calories and states she is following her eating plan approximately 70% of the time. Sonya Hatfield states she is doing 0 minutes 0 times per week.  Today's visit was #: 3 Starting weight: 201 lbs Starting date: 07/16/2020 Today's weight: 201 lbs Today's date: 08/12/2020 Total lbs lost to date: 0 Total lbs lost since last in-office visit: 1  Interim History: Sonya Hatfield is a patient of Dr. Avie Arenas, and this is her first office visit with me. She is still not eating food on the meal plan, and it is currently 3 pm and she hasn't eaten yet today. She denies hunger, and she is always skipping breakfast.  Subjective:   1. Vitamin D deficiency Sonya Hatfield was prescribed prescription vitamin D 50,000 IU each week, but she hasn't started it yet. She denies nausea, vomiting or muscle weakness.  2. At risk for malnutrition Sonya Hatfield is at increased risk for malnutrition due to skipping meals.  Assessment/Plan:  No orders of the defined types were placed in this encounter.   There are no discontinued medications.   No orders of the defined types were placed in this encounter.    1. Vitamin D deficiency Low Vitamin D level contributes to fatigue and are associated with obesity, breast, and colon cancer. She was educated on the importance of proper serum of Vitamin D, and the benefits to her physical and emotional well being of the supplementations. Sonya Hatfield agreed to continue prescription Vitamin D 50,000 IU every week and will follow-up for routine testing of Vitamin D, at least 2-3 times per year to avoid over-replacement.  2. At risk for malnutrition Sonya Hatfield was given extensive malnutrition prevention education and counseling today of more than 8 minutes.  Counseled her that malnutrition refers to inappropriate  nutrients or not the right balance of nutrients for optimal health.  Discussed with Sonya Hatfield that it is absolutely possible to be malnourished but yet obese.  Risk factors, including but not limited to, inappropriate dietary choices, difficulty with obtaining food due to physical or financial limitations, and various physical and mental health conditions were reviewed with Sonya Hatfield.   3. Obesity with current BMI of 34.6 Sonya Hatfield is currently in the action stage of change. As such, her goal is to continue with weight loss efforts. She has agreed to the Category 2 Plan + 100 calories.   Sonya Hatfield is to drink a protein shake for breakfast, only if she can not eat. She is to focus on eating dinner on the plan.  Exercise goals: As is.  Behavioral modification strategies: increasing lean protein intake, no skipping meals, and meal planning and cooking strategies.  Sonya Hatfield has agreed to follow-up with our clinic in 2 weeks. She was informed of the importance of frequent follow-up visits to maximize her success with intensive lifestyle modifications for her multiple health conditions.   Objective:   Blood pressure 120/84, pulse 65, temperature 98.4 F (36.9 C), height '5\' 4"'$  (1.626 m), weight 201 lb (91.2 kg), last menstrual period 07/30/2020, SpO2 98 %. Body mass index is 34.5 kg/m.  General: Cooperative, alert, well developed, in no acute distress. HEENT: Conjunctivae and lids unremarkable. Cardiovascular: Regular rhythm.  Lungs: Normal work of breathing. Neurologic: No focal deficits.   Lab Results  Component Value Date   CREATININE 0.79 07/16/2020  BUN 11 07/16/2020   NA 139 07/16/2020   K 4.4 07/16/2020   CL 101 07/16/2020   CO2 22 07/16/2020   Lab Results  Component Value Date   ALT 16 07/16/2020   AST 15 07/16/2020   ALKPHOS 70 07/16/2020   BILITOT 0.3 07/16/2020   Lab Results  Component Value Date   HGBA1C 5.3 07/16/2020   Lab Results  Component Value Date    INSULIN 3.8 07/16/2020   Lab Results  Component Value Date   TSH 3.920 07/16/2020   Lab Results  Component Value Date   CHOL 162 07/16/2020   HDL 64 07/16/2020   LDLCALC 88 07/16/2020   TRIG 47 07/16/2020   Lab Results  Component Value Date   VD25OH 17.3 (L) 07/16/2020   Lab Results  Component Value Date   WBC 4.7 07/16/2020   HGB 13.4 07/16/2020   HCT 41.4 07/16/2020   MCV 84 07/16/2020   PLT 232 07/16/2020   Lab Results  Component Value Date   IRON 39 07/16/2020   TIBC 347 07/16/2020   FERRITIN 41 07/16/2020   Attestation Statements:   Reviewed by clinician on day of visit: allergies, medications, problem list, medical history, surgical history, family history, social history, and previous encounter notes.   Wilhemena Durie, am acting as transcriptionist for [employer], DO.  I have reviewed the above documentation for accuracy and completeness, and I agree with the above. Marjory Sneddon, D.O.  The [place] was signed into law in 2016 which includes the topic of electronic health records.  This provides immediate access to information in MyChart.  This includes consultation notes, operative notes, office notes, lab results and pathology reports.  If you have any questions about what you read please let us know at your next visit so we can discuss your concerns and take corrective action if need be.  We are right here with you.

## 2020-08-26 ENCOUNTER — Ambulatory Visit (INDEPENDENT_AMBULATORY_CARE_PROVIDER_SITE_OTHER): Payer: 59 | Admitting: Family Medicine

## 2020-08-31 ENCOUNTER — Ambulatory Visit (INDEPENDENT_AMBULATORY_CARE_PROVIDER_SITE_OTHER): Payer: Self-pay | Admitting: Family Medicine

## 2020-08-31 ENCOUNTER — Encounter (HOSPITAL_COMMUNITY): Payer: Self-pay | Admitting: Emergency Medicine

## 2020-08-31 ENCOUNTER — Other Ambulatory Visit: Payer: Self-pay

## 2020-08-31 ENCOUNTER — Ambulatory Visit (HOSPITAL_COMMUNITY)
Admission: EM | Admit: 2020-08-31 | Discharge: 2020-08-31 | Disposition: A | Discharge status: Home / Self Care | Payer: 59 | Attending: Sports Medicine | Admitting: Sports Medicine

## 2020-08-31 ENCOUNTER — Encounter (INDEPENDENT_AMBULATORY_CARE_PROVIDER_SITE_OTHER): Payer: Self-pay | Admitting: Family Medicine

## 2020-08-31 VITALS — BP 118/74 | HR 67 | Temp 98.2°F | Ht 64.0 in | Wt 206.0 lb

## 2020-08-31 DIAGNOSIS — E669 Obesity, unspecified: Secondary | ICD-10-CM

## 2020-08-31 DIAGNOSIS — E559 Vitamin D deficiency, unspecified: Secondary | ICD-10-CM

## 2020-08-31 DIAGNOSIS — Z6834 Body mass index (BMI) 34.0-34.9, adult: Secondary | ICD-10-CM

## 2020-08-31 DIAGNOSIS — H6123 Impacted cerumen, bilateral: Secondary | ICD-10-CM | POA: Diagnosis not present

## 2020-08-31 DIAGNOSIS — H6121 Impacted cerumen, right ear: Secondary | ICD-10-CM

## 2020-08-31 DIAGNOSIS — Z9189 Other specified personal risk factors, not elsewhere classified: Secondary | ICD-10-CM

## 2020-08-31 DIAGNOSIS — K5909 Other constipation: Secondary | ICD-10-CM

## 2020-08-31 MED ORDER — CARBAMIDE PEROXIDE 6.5 % OT SOLN: 10.0000 [drp] | Freq: Two times a day (BID) | OTIC | 0 refills | Status: AC

## 2020-08-31 MED ORDER — VITAMIN D (ERGOCALCIFEROL) 1.25 MG (50000 UNIT) PO CAPS: 50000.0000 [IU] | ORAL_CAPSULE | ORAL | 0 refills | Status: DC

## 2020-08-31 NOTE — ED Triage Notes (Signed)
Called x 1 no answer

## 2020-08-31 NOTE — ED Triage Notes (Signed)
Right ear pain since Sunday AM

## 2020-08-31 NOTE — ED Notes (Signed)
Call x2, no answer from [facility]

## 2020-08-31 NOTE — Discharge Instructions (Addendum)
10 drops of Debrox twice a day into the right ear to soften earwax.  May take a warm washcloth and dab the outside of the ear,/use suction to remove the earwax.  May also take a Kleenex and gently wipe the outer canal of the ear.  Do not use Q-tips.  Follow-up with PCP if this continues.
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
## 2020-09-01 NOTE — Progress Notes (Signed)
Chief Complaint:   OBESITY Sonya Hatfield is here to discuss her progress with her obesity treatment plan along with follow-up of her obesity related diagnoses. Sonya Hatfield is on the Category 2 Plan + 100 calories and states she is following her eating plan approximately 25% of the time. Sonya Hatfield states she is not currently exercising.  Today's visit was #: 4 Starting weight: 201 lbs Starting date: 07/16/2020 Today's weight: 206 lbs Today's date: 08/31/2020 Total lbs lost to date: 0 Total lbs lost since last in-office visit: +5  Interim History: Sonya Hatfield has not followed plan very much the past several weeks. She reports increased stressors and no time to meal prep. She is bored with lunch and needs meal prep ideas.  Assessment/Plan:  No orders of the defined types were placed in this encounter.   Medications Discontinued During This Encounter  Medication Reason   Vitamin D, Ergocalciferol, (DRISDOL) 1.25 MG (50000 UNIT) CAPS capsule Reorder     Meds ordered this encounter  Medications   Vitamin D, Ergocalciferol, (DRISDOL) 1.25 MG (50000 UNIT) CAPS capsule    Sig: Take 1 capsule (50,000 Units total) by mouth every 7 (seven) days.    Dispense:  4 capsule    Refill:  0    Sonya Hatfield for RF     1. Vitamin D deficiency Not at goal.   Plan: Continue to take prescription Vitamin D '@50'$ ,000 IU every week as prescribed.  Follow-up for routine testing of Vitamin D, at least 2-3 times per year to avoid over-replacement.  Lab Results  Component Value Date   VD25OH 17.3 (L) 07/16/2020   Refill- Vitamin D, Ergocalciferol, (DRISDOL) 1.25 MG (50000 UNIT) CAPS capsule; Take 1 capsule (50,000 Units total) by mouth every 7 (seven) days.  Dispense: 4 capsule; Refill: 0  2. Other constipation Constipation has been an issue for Sonya Hatfield all of her life. She hasn't had a bowel movement in 3 days. She doesn't like Miralax.  Plan: CALM supplement recommended every night. Increase water intake to 1/2 weight in  ounces per day. Follow prudent nutritional plan, which has increased fiber in it.  3. At risk for deficient intake of food Sonya Hatfield is at risk for deficient food intake due to skipping meals. She was given extensive education and counseling today of more than 15 minutes on risks associated with deficient food intake.  Counseled her on the importance of following our prescribed meal plan and eating adequate amounts of protein.  Discussed with Sonya Hatfield that inadequate food intake over longer periods of time can slow their metabolism down significantly.   4. Obesity with current BMI of 35.4  Sonya Hatfield is currently in the action stage of change. As such, her goal is to continue with weight loss efforts. She has agreed to the Category 2 Plan with lunch options.   Exercise goals:  As is  Behavioral modification strategies: increasing lean protein intake, decreasing simple carbohydrates, and no skipping meals.  Sonya Hatfield has agreed to follow-up with our clinic in 3 weeks. She was informed of the importance of frequent follow-up visits to maximize her success with intensive lifestyle modifications for her multiple health conditions.   Objective:   Blood pressure 118/74, pulse 67, temperature 98.2 F (36.8 C), height '5\' 4"'$  (1.626 m), weight 206 lb (93.4 kg), last menstrual period 08/10/2020, SpO2 99 %. Body mass index is 35.36 kg/m.  General: Cooperative, alert, well developed, in no acute distress. HEENT: Conjunctivae and lids unremarkable. Cardiovascular: Regular rhythm.  Lungs: Normal work  of breathing. Neurologic: No focal deficits.   Lab Results  Component Value Date   CREATININE 0.79 07/16/2020   BUN 11 07/16/2020   NA 139 07/16/2020   K 4.4 07/16/2020   CL 101 07/16/2020   CO2 22 07/16/2020   Lab Results  Component Value Date   ALT 16 07/16/2020   AST 15 07/16/2020   ALKPHOS 70 07/16/2020   BILITOT 0.3 07/16/2020   Lab Results  Component Value Date   HGBA1C 5.3 07/16/2020    Lab Results  Component Value Date   INSULIN 3.8 07/16/2020   Lab Results  Component Value Date   TSH 3.920 07/16/2020   Lab Results  Component Value Date   CHOL 162 07/16/2020   HDL 64 07/16/2020   LDLCALC 88 07/16/2020   TRIG 47 07/16/2020   Lab Results  Component Value Date   VD25OH 17.3 (L) 07/16/2020   Lab Results  Component Value Date   WBC 4.7 07/16/2020   HGB 13.4 07/16/2020   HCT 41.4 07/16/2020   MCV 84 07/16/2020   PLT 232 07/16/2020   Lab Results  Component Value Date   IRON 39 07/16/2020   TIBC 347 07/16/2020   FERRITIN 41 07/16/2020   Attestation Statements:   Reviewed by clinician on day of visit: allergies, medications, problem list, medical history, surgical history, family history, social history, and previous encounter notes.  Coral Ceo, CMA, am acting as transcriptionist for [employer], DO.  I have reviewed the above documentation for accuracy and completeness, and I agree with the above. Marjory Sneddon, D.O.  The [place] was signed into law in 2016 which includes the topic of electronic health records.  This provides immediate access to information in MyChart.  This includes consultation notes, operative notes, office notes, lab results and pathology reports.  If you have any questions about what you read please let us know at your next visit so we can discuss your concerns and take corrective action if need be.  We are right here with you.

## 2020-09-15 ENCOUNTER — Encounter (INDEPENDENT_AMBULATORY_CARE_PROVIDER_SITE_OTHER): Payer: Self-pay

## 2020-09-21 ENCOUNTER — Ambulatory Visit (INDEPENDENT_AMBULATORY_CARE_PROVIDER_SITE_OTHER): Payer: Self-pay | Admitting: Family Medicine

## 2020-09-21 ENCOUNTER — Encounter (INDEPENDENT_AMBULATORY_CARE_PROVIDER_SITE_OTHER): Payer: Self-pay | Admitting: Family Medicine

## 2020-09-21 ENCOUNTER — Other Ambulatory Visit: Payer: Self-pay

## 2020-09-21 VITALS — BP 129/72 | HR 70 | Temp 98.6°F | Ht 64.0 in | Wt 205.0 lb

## 2020-09-21 DIAGNOSIS — Z6834 Body mass index (BMI) 34.0-34.9, adult: Secondary | ICD-10-CM

## 2020-09-21 DIAGNOSIS — Z9189 Other specified personal risk factors, not elsewhere classified: Secondary | ICD-10-CM

## 2020-09-21 DIAGNOSIS — E669 Obesity, unspecified: Secondary | ICD-10-CM

## 2020-09-21 DIAGNOSIS — E559 Vitamin D deficiency, unspecified: Secondary | ICD-10-CM

## 2020-09-22 NOTE — Progress Notes (Signed)
Chief Complaint:   OBESITY Sonya Hatfield is here to discuss her progress with her obesity treatment plan along with follow-up of her obesity related diagnoses. Sonya Hatfield is on the Category 2 Plan with lunch options and states she is following her eating plan approximately 75% of the time. Sonya Hatfield states she is doing 0 minutes 0 times per week.  Today's visit was #: 5 Starting weight: 201 lbs Starting date: 07/16/2020 Today's weight: 205 lbs Today's date: 09/21/2020 Total lbs lost to date: 0 Total lbs lost since last in-office visit: 1  Interim History: Sonya Hatfield is trying to do something outside or indoors every day, (30 minutes q daily) which is her goal for the future. She is not really looking at the meal plan.  Subjective:   1. Vitamin D deficiency Sonya Hatfield is currently taking prescription vitamin D 50,000 IU each week. She denies nausea, vomiting or muscle weakness.  2. At risk for dehydration Sonya Hatfield is at risk for dehydration due to inadequate water intake.  Assessment/Plan:  No orders of the defined types were placed in this encounter.   There are no discontinued medications.   No orders of the defined types were placed in this encounter.    1. Vitamin D deficiency Low Vitamin D level contributes to fatigue and are associated with obesity, breast, and colon cancer. Sonya Hatfield will continue prescription Vitamin D 50,000 IU every week and will follow-up for routine testing of Vitamin D, at least 2-3 times per year to avoid over-replacement.  2. At risk for dehydration Sonya Hatfield was given approximately 9 minutes dehydration prevention counseling today. Sonya Hatfield is at risk for dehydration due to weight loss and current medication(s). She was encouraged to hydrate and monitor fluid status to avoid dehydration as well as weight loss plateaus.   3. Obesity with current BMI of 35.3 Sonya Hatfield is currently in the action stage of change. As such, her goal is to continue with weight loss efforts. She has  agreed to the Category 2 Plan with lunch options.   Another copy of the meal plan was given to the patient today.  Exercise goals: For substantial health benefits, adults should do at least 150 minutes (2 hours and 30 minutes) a week of moderate-intensity, or 75 minutes (1 hour and 15 minutes) a week of vigorous-intensity aerobic physical activity, or an equivalent combination of moderate- and vigorous-intensity aerobic activity. Aerobic activity should be performed in episodes of at least 10 minutes, and preferably, it should be spread throughout the week.  Behavioral modification strategies: increasing lean protein intake, decreasing simple carbohydrates, and planning for success.  Sonya Hatfield has agreed to follow-up with our clinic in 2 weeks. She was informed of the importance of frequent follow-up visits to maximize her success with intensive lifestyle modifications for her multiple health conditions.   Objective:   Blood pressure 129/72, pulse 70, temperature 98.6 F (37 C), height 5\' 4"  (1.626 m), weight 205 lb (93 kg), SpO2 98 %. Body mass index is 35.19 kg/m.  General: Cooperative, alert, well developed, in no acute distress. HEENT: Conjunctivae and lids unremarkable. Cardiovascular: Regular rhythm.  Lungs: Normal work of breathing. Neurologic: No focal deficits.   Lab Results  Component Value Date   CREATININE 0.79 07/16/2020   BUN 11 07/16/2020   NA 139 07/16/2020   K 4.4 07/16/2020   CL 101 07/16/2020   CO2 22 07/16/2020   Lab Results  Component Value Date   ALT 16 07/16/2020   AST 15 07/16/2020   ALKPHOS  70 07/16/2020   BILITOT 0.3 07/16/2020   Lab Results  Component Value Date   HGBA1C 5.3 07/16/2020   Lab Results  Component Value Date   INSULIN 3.8 07/16/2020   Lab Results  Component Value Date   TSH 3.920 07/16/2020   Lab Results  Component Value Date   CHOL 162 07/16/2020   HDL 64 07/16/2020   LDLCALC 88 07/16/2020   TRIG 47 07/16/2020   Lab  Results  Component Value Date   VD25OH 17.3 (L) 07/16/2020   Lab Results  Component Value Date   WBC 4.7 07/16/2020   HGB 13.4 07/16/2020   HCT 41.4 07/16/2020   MCV 84 07/16/2020   PLT 232 07/16/2020   Lab Results  Component Value Date   IRON 39 07/16/2020   TIBC 347 07/16/2020   FERRITIN 41 07/16/2020   Attestation Statements:   Reviewed by clinician on day of visit: allergies, medications, problem list, medical history, surgical history, family history, social history, and previous encounter notes.   Wilhemena Durie, am acting as transcriptionist for [employer], DO.  I have reviewed the above documentation for accuracy and completeness, and I agree with the above. Marjory Sneddon, D.O.  The [place] was signed into law in 2016 which includes the topic of electronic health records.  This provides immediate access to information in MyChart.  This includes consultation notes, operative notes, office notes, lab results and pathology reports.  If you have any questions about what you read please let us know at your next visit so we can discuss your concerns and take corrective action if need be.  We are right here with you.

## 2020-10-06 ENCOUNTER — Encounter (INDEPENDENT_AMBULATORY_CARE_PROVIDER_SITE_OTHER): Payer: Self-pay | Admitting: Family Medicine

## 2020-10-06 ENCOUNTER — Ambulatory Visit (INDEPENDENT_AMBULATORY_CARE_PROVIDER_SITE_OTHER): Payer: 59 | Admitting: Family Medicine

## 2020-10-06 ENCOUNTER — Other Ambulatory Visit: Payer: Self-pay

## 2020-10-06 VITALS — BP 122/84 | HR 70 | Temp 98.0°F | Ht 64.0 in | Wt 207.0 lb

## 2020-10-06 DIAGNOSIS — Z6834 Body mass index (BMI) 34.0-34.9, adult: Secondary | ICD-10-CM

## 2020-10-06 DIAGNOSIS — E669 Obesity, unspecified: Secondary | ICD-10-CM | POA: Diagnosis not present

## 2020-10-06 DIAGNOSIS — Z9189 Other specified personal risk factors, not elsewhere classified: Secondary | ICD-10-CM | POA: Diagnosis not present

## 2020-10-06 DIAGNOSIS — E559 Vitamin D deficiency, unspecified: Secondary | ICD-10-CM

## 2020-10-06 MED ORDER — VITAMIN D (ERGOCALCIFEROL) 1.25 MG (50000 UNIT) PO CAPS: 50000.0000 [IU] | ORAL_CAPSULE | ORAL | 0 refills | Status: DC

## 2020-10-07 NOTE — Progress Notes (Signed)
Chief Complaint:   OBESITY Sonya Hatfield is here to discuss her progress with her obesity treatment plan along with follow-up of her obesity related diagnoses. Sonya Hatfield is on the Category 2 Plan with lunch options and states she is following her eating plan approximately 30% of the time. Sonya Hatfield states she is doing online intense workout videos 20 minutes 1-2 times per week.  Today's visit was #: 6 Starting weight: 201 lbs Starting date: 07/16/2020 Today's weight: 207 lbs Today's date: 10/06/2020 Total lbs lost to date: 0 Total lbs lost since last in-office visit: 0  Interim History: Sonya Hatfield states she did not follow the plan and is not sure why.  Sonya Hatfield is here for a follow up office visit.  We reviewed her meal plan and questions were answered.  Patient's food recall appears to be accurate and consistent with what is on plan when she is following it.   When eating on plan, her hunger and cravings are well controlled.    Subjective:   1. Vitamin D deficiency She is currently taking prescription vitamin D 50,000 IU each week. She denies nausea, vomiting or muscle weakness.  Lab Results  Component Value Date   VD25OH 17.3 (L) 07/16/2020   2. At risk for dehydration Sonya Hatfield is at risk for dehydration due to poor water intake.  Assessment/Plan:  No orders of the defined types were placed in this encounter.   Medications Discontinued During This Encounter  Medication Reason   Vitamin D, Ergocalciferol, (DRISDOL) 1.25 MG (50000 UNIT) CAPS capsule Reorder     Meds ordered this encounter  Medications   Vitamin D, Ergocalciferol, (DRISDOL) 1.25 MG (50000 UNIT) CAPS capsule    Sig: Take 1 capsule (50,000 Units total) by mouth every 7 (seven) days.    Dispense:  4 capsule    Refill:  0    30 d supply;  ** OV for RF **   Do not send RF request     1. Vitamin D deficiency Low Vitamin D level contributes to fatigue and are associated with obesity, breast, and colon cancer. She  agrees to continue to take prescription Vitamin D 50,000 IU every week and will follow-up for routine testing of Vitamin D, at least 2-3 times per year to avoid over-replacement.  Refill- Vitamin D, Ergocalciferol, (DRISDOL) 1.25 MG (50000 UNIT) CAPS capsule; Take 1 capsule (50,000 Units total) by mouth every 7 (seven) days.  Dispense: 4 capsule; Refill: 0  2. At risk for dehydration Sonya Hatfield was given approximately 9 minutes dehydration prevention counseling today. Sonya Hatfield is at risk for dehydration due to weight loss and current medication(s). She was encouraged to hydrate and monitor fluid status to avoid dehydration as well as weight loss plateaus.    3. Obesity with current BMI of 35.6  Sonya Hatfield is currently in the action stage of change. As such, her goal is to continue with weight loss efforts. She has agreed to change to keeping a food journal and adhering to recommended goals of 1300-1400 calories and 90+ grams protein.   Pt is going to [place] on October 28th and wants to weight prior.  Exercise goals:  As is  Behavioral modification strategies: meal planning and cooking strategies, keeping healthy foods in the home, and avoiding temptations.  Sonya Hatfield has agreed to follow-up with our clinic in 3 weeks. She was informed of the importance of frequent follow-up visits to maximize her success with intensive lifestyle modifications for her multiple health conditions.   Objective:  Blood pressure 122/84, pulse 70, temperature 98 F (36.7 C), height 5\' 4"  (1.626 m), weight 207 lb (93.9 kg), SpO2 97 %. Body mass index is 35.53 kg/m.  General: Cooperative, alert, well developed, in no acute distress. HEENT: Conjunctivae and lids unremarkable. Cardiovascular: Regular rhythm.  Lungs: Normal work of breathing. Neurologic: No focal deficits.   Lab Results  Component Value Date   CREATININE 0.79 07/16/2020   BUN 11 07/16/2020   NA 139 07/16/2020   K 4.4 07/16/2020   CL 101 07/16/2020   CO2  22 07/16/2020   Lab Results  Component Value Date   ALT 16 07/16/2020   AST 15 07/16/2020   ALKPHOS 70 07/16/2020   BILITOT 0.3 07/16/2020   Lab Results  Component Value Date   HGBA1C 5.3 07/16/2020   Lab Results  Component Value Date   INSULIN 3.8 07/16/2020   Lab Results  Component Value Date   TSH 3.920 07/16/2020   Lab Results  Component Value Date   CHOL 162 07/16/2020   HDL 64 07/16/2020   LDLCALC 88 07/16/2020   TRIG 47 07/16/2020   Lab Results  Component Value Date   VD25OH 17.3 (L) 07/16/2020   Lab Results  Component Value Date   WBC 4.7 07/16/2020   HGB 13.4 07/16/2020   HCT 41.4 07/16/2020   MCV 84 07/16/2020   PLT 232 07/16/2020   Lab Results  Component Value Date   IRON 39 07/16/2020   TIBC 347 07/16/2020   FERRITIN 41 07/16/2020    Attestation Statements:   Reviewed by clinician on day of visit: allergies, medications, problem list, medical history, surgical history, family history, social history, and previous encounter notes.  Coral Ceo, CMA, am acting as transcriptionist for [employer], DO.  I have reviewed the above documentation for accuracy and completeness, and I agree with the above. Marjory Sneddon, D.O.  The [place] was signed into law in 2016 which includes the topic of electronic health records.  This provides immediate access to information in MyChart.  This includes consultation notes, operative notes, office notes, lab results and pathology reports.  If you have any questions about what you read please let us know at your next visit so we can discuss your concerns and take corrective action if need be.  We are right here with you.

## 2020-10-15 ENCOUNTER — Encounter (HOSPITAL_COMMUNITY): Payer: Self-pay | Admitting: Emergency Medicine

## 2020-10-15 ENCOUNTER — Ambulatory Visit (HOSPITAL_COMMUNITY)
Admission: EM | Admit: 2020-10-15 | Discharge: 2020-10-15 | Disposition: A | Discharge status: Home / Self Care | Payer: 59 | Attending: Physician Assistant | Admitting: Physician Assistant

## 2020-10-15 ENCOUNTER — Other Ambulatory Visit: Payer: Self-pay

## 2020-10-15 DIAGNOSIS — M545 Low back pain, unspecified: Secondary | ICD-10-CM

## 2020-10-15 MED ORDER — CYCLOBENZAPRINE HCL 10 MG PO TABS: 10.0000 mg | ORAL_TABLET | Freq: Three times a day (TID) | ORAL | 0 refills | Status: DC | PRN

## 2020-10-15 MED ORDER — DICLOFENAC SODIUM 75 MG PO TBEC
75.0000 mg | DELAYED_RELEASE_TABLET | Freq: Two times a day (BID) | ORAL | 0 refills | Status: DC
Start: 2020-10-15 — End: 2021-05-12
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
## 2020-10-15 NOTE — ED Triage Notes (Addendum)
Back pain started 2-3 days ago.  Lower back pain, patient stumbbled back on Sunday and did suffer a "soft fall"

## 2020-10-22 ENCOUNTER — Encounter (INDEPENDENT_AMBULATORY_CARE_PROVIDER_SITE_OTHER): Payer: Self-pay

## 2020-10-26 ENCOUNTER — Ambulatory Visit (INDEPENDENT_AMBULATORY_CARE_PROVIDER_SITE_OTHER): Payer: Self-pay | Admitting: Family Medicine

## 2020-10-26 ENCOUNTER — Ambulatory Visit (INDEPENDENT_AMBULATORY_CARE_PROVIDER_SITE_OTHER): Payer: 59 | Admitting: Family Medicine

## 2020-10-26 ENCOUNTER — Encounter (INDEPENDENT_AMBULATORY_CARE_PROVIDER_SITE_OTHER): Payer: Self-pay

## 2021-05-06 ENCOUNTER — Encounter: Payer: Self-pay | Admitting: Radiology

## 2021-05-12 ENCOUNTER — Encounter: Payer: Self-pay | Admitting: Radiology

## 2021-05-12 ENCOUNTER — Other Ambulatory Visit (HOSPITAL_COMMUNITY)
Admission: RE | Admit: 2021-05-12 | Discharge: 2021-05-12 | Disposition: A | Discharge status: Home / Self Care | Payer: Self-pay | Source: Ambulatory Visit | Attending: Radiology | Admitting: Radiology

## 2021-05-12 ENCOUNTER — Ambulatory Visit (INDEPENDENT_AMBULATORY_CARE_PROVIDER_SITE_OTHER): Payer: 59 | Admitting: Radiology

## 2021-05-12 VITALS — BP 118/84 | Ht 64.5 in | Wt 218.0 lb

## 2021-05-12 DIAGNOSIS — Z01419 Encounter for gynecological examination (general) (routine) without abnormal findings: Secondary | ICD-10-CM | POA: Diagnosis not present

## 2021-05-12 DIAGNOSIS — Z3046 Encounter for surveillance of implantable subdermal contraceptive: Secondary | ICD-10-CM

## 2021-05-12 DIAGNOSIS — Z30012 Encounter for prescription of emergency contraception: Secondary | ICD-10-CM

## 2021-05-12 MED ORDER — LEVONORGESTREL 1.5 MG PO TABS
1.5000 mg | ORAL_TABLET | Freq: Once | ORAL | Status: DC
Start: 2021-05-12 — End: 2021-09-02

## 2021-05-12 NOTE — Progress Notes (Signed)
? ?  Sonya Hatfield 1995/01/20 [id number] ? ? ?History:  26 y.o. G1P1 presents for annual exam. C/o fishy vaginal odor. No itching or discharge. Due for Nexplanon removal and replacement. Thinks nexplanon was placed 01/2018. Did have unprotected intercourse last night for the first time in 1 year. ? ?Gynecologic History ?Patient's last menstrual period was 04/28/2021 (approximate). ?Period Pattern: Regular ?Contraception/Family planning: Nexplanon ?Sexually active: yes ?Last Pap: 2018. Results were: normal ? ? ?Obstetric History ?OB History  ?Gravida Para Term Preterm AB Living  ?'1 1 1     1  '$ ?SAB IAB Ectopic Multiple Live Births  ?        1  ?  ?# Outcome Date GA Lbr Len/2nd Weight Sex Delivery Anes PTL Lv  ?1 Term 12/10/17        LIV  ? ? ? ?The following portions of the patient's history were reviewed and updated as appropriate: allergies, current medications, past family history, past medical history, past social history, past surgical history, and problem list. ? ?Review of Systems ?Pertinent items noted in HPI and remainder of comprehensive ROS otherwise negative.  ? ?Past medical history, past surgical history, family history and social history were all reviewed and documented in the EPIC chart. ? ? ?Exam: ? ?Vitals:  ? 05/12/21 1129  ?BP: 118/84  ?Weight: 218 lb (98.9 kg)  ?Height: 5' 4.5" (1.638 m)  ? ?Body mass index is 36.84 kg/m?. ? ?General appearance:  Normal ?Thyroid:  Symmetrical, normal in size, without palpable masses or nodularity. ?Respiratory ? Auscultation:  Clear without wheezing or rhonchi ?Cardiovascular ? Auscultation:  Regular rate, without rubs, murmurs or gallops ? Edema/varicosities:  Not grossly evident ?Abdominal ? Soft,nontender, without masses, guarding or rebound. ? Liver/spleen:  No organomegaly noted ? Hernia:  None appreciated ? Skin ? Inspection:  Grossly normal ?Breasts: Examined lying and sitting.  ? Right: Without masses, retractions, nipple discharge or axillary  adenopathy. ? ? Left: Without masses, retractions, nipple discharge or axillary adenopathy. ?Genitourinary  ? Inguinal/mons:  Normal without inguinal adenopathy ? External genitalia:  Normal appearing vulva with no masses, tenderness, or lesions ? BUS/Urethra/Skene's glands:  Normal without masses or exudate ? Vagina:  Normal appearing with normal color and discharge, no lesions ? Cervix:  Normal appearing without discharge or lesions ? Uterus:  Normal in size, shape and contour.  Mobile, nontender ? Adnexa/parametria:   ?  Rt: Normal in size, without masses or tenderness. ?  Lt: Normal in size, without masses or tenderness. ? Anus and perineum: Normal ?  ?Patient informed chaperone available to be present for breast and pelvic exam. Patient has requested no chaperone to be present. Patient has been advised what will be completed during breast and pelvic exam.  ? ?Assessment/Plan:   ?1. Well woman exam with routine gynecological exam ?Pap with cotesting and STI screen ?- Cytology - PAP( [facility]) ? ?2. Encounter for removal and reinsertion of Nexplanon ?Schedule ASAP ?- Remove and insert drug implant; Future ? ?3. Encounter for emergency contraception ?Plan b today for UPI yesterday  ?- levonorgestrel (PLAN B 1-STEP) tablet 1.5 mg ?  ? ? ?Discussed SBE, pap and STI screening as directed/appropriate. Recommend 136mns of exercise weekly, including weight bearing exercise. Encouraged the use of seatbelts and sunscreen. ?Return in 1 year for annual or as needed.  ? ?CRubbie BattiestB WHNP-BC 12:03 PM 05/12/2021  ?

## 2021-05-13 ENCOUNTER — Ambulatory Visit (INDEPENDENT_AMBULATORY_CARE_PROVIDER_SITE_OTHER): Payer: 59 | Admitting: Radiology

## 2021-05-13 ENCOUNTER — Ambulatory Visit: Payer: 59 | Admitting: Radiology

## 2021-05-13 VITALS — BP 118/74

## 2021-05-13 DIAGNOSIS — Z30016 Encounter for initial prescription of transdermal patch hormonal contraceptive device: Secondary | ICD-10-CM

## 2021-05-13 DIAGNOSIS — Z01812 Encounter for preprocedural laboratory examination: Secondary | ICD-10-CM | POA: Diagnosis not present

## 2021-05-13 DIAGNOSIS — Z3045 Encounter for surveillance of transdermal patch hormonal contraceptive device: Secondary | ICD-10-CM

## 2021-05-13 DIAGNOSIS — Z3046 Encounter for surveillance of implantable subdermal contraceptive: Secondary | ICD-10-CM

## 2021-05-13 LAB — PREGNANCY, URINE: Preg Test, Ur: NEGATIVE

## 2021-05-13 MED ORDER — XULANE 150-35 MCG/24HR TD PTWK: 1.0000 | MEDICATED_PATCH | TRANSDERMAL | 4 refills | Status: DC
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
## 2021-05-17 LAB — CYTOLOGY - PAP
Chlamydia: POSITIVE — AB
Comment: NEGATIVE
Comment: NEGATIVE
Comment: NORMAL
Diagnosis: NEGATIVE
Neisseria Gonorrhea: NEGATIVE
Trichomonas: NEGATIVE

## 2021-05-18 ENCOUNTER — Other Ambulatory Visit: Payer: Self-pay

## 2021-05-18 DIAGNOSIS — A749 Chlamydial infection, unspecified: Secondary | ICD-10-CM

## 2021-05-18 MED ORDER — DOXYCYCLINE HYCLATE 100 MG PO CAPS: 100.0000 mg | ORAL_CAPSULE | Freq: Two times a day (BID) | ORAL | 0 refills | Status: AC

## 2021-07-01 ENCOUNTER — Ambulatory Visit: Payer: 59 | Admitting: Radiology

## 2021-07-02 ENCOUNTER — Encounter: Payer: Self-pay | Admitting: Nurse Practitioner

## 2021-07-02 ENCOUNTER — Ambulatory Visit (INDEPENDENT_AMBULATORY_CARE_PROVIDER_SITE_OTHER): Payer: Commercial Managed Care - HMO | Admitting: Nurse Practitioner

## 2021-07-02 VITALS — BP 118/74

## 2021-07-02 DIAGNOSIS — A749 Chlamydial infection, unspecified: Secondary | ICD-10-CM | POA: Diagnosis not present

## 2021-07-02 DIAGNOSIS — B9689 Other specified bacterial agents as the cause of diseases classified elsewhere: Secondary | ICD-10-CM

## 2021-07-02 DIAGNOSIS — N898 Other specified noninflammatory disorders of vagina: Secondary | ICD-10-CM

## 2021-07-02 DIAGNOSIS — N760 Acute vaginitis: Secondary | ICD-10-CM

## 2021-07-02 DIAGNOSIS — Z30015 Encounter for initial prescription of vaginal ring hormonal contraceptive: Secondary | ICD-10-CM

## 2021-07-02 DIAGNOSIS — N912 Amenorrhea, unspecified: Secondary | ICD-10-CM | POA: Diagnosis not present

## 2021-07-02 LAB — WET PREP FOR TRICH, YEAST, CLUE

## 2021-07-02 LAB — PREGNANCY, URINE: Preg Test, Ur: NEGATIVE

## 2021-07-02 MED ORDER — ETONOGESTREL-ETHINYL ESTRADIOL 0.12-0.015 MG/24HR VA RING: 1.0000 | VAGINAL_RING | VAGINAL | 2 refills | Status: DC

## 2021-07-02 MED ORDER — METRONIDAZOLE 500 MG PO TABS: 500.0000 mg | ORAL_TABLET | Freq: Two times a day (BID) | ORAL | 0 refills | Status: DC

## 2021-07-02 NOTE — Progress Notes (Signed)
   Acute Office Visit  Subjective:    Patient ID: Sonya Hatfield, female    DOB: 1995-06-23, 26 y.o.   MRN: [id number]   HPI 26 y.o. presents today for TOC. + Chlamydia 05/12/2021. Finished full course of Doxycycline. Having vaginal odor today without discharge or itching. Has had odor for a while and assumed it was from STI. Used boric acid suppositories x 5 days with no improvement. LMP in early May. She took home pregnancy test and thought she may have seen a faint line. Had Nexplanon removed 05/13/2021 and started using patch for contraception but it caused skin irritation so she stopped use. Wants to discuss other options.    Review of Systems  Constitutional: Negative.   Genitourinary:  Negative for vaginal discharge.       Vaginal odor       Objective:    Physical Exam Constitutional:      Appearance: Normal appearance.  Genitourinary:    General: Normal vulva.     Vagina: Normal.     Cervix: Normal.     BP 118/74   LMP 05/07/2021 (Approximate)  Wt Readings from Last 3 Encounters:  05/12/21 218 lb (98.9 kg)  10/06/20 207 lb (93.9 kg)  09/21/20 205 lb (93 kg)        Patient informed chaperone available to be present for breast and/or pelvic exam. Patient has requested no chaperone to be present. Patient has been advised what will be completed during breast and pelvic exam.   UPT negative Wet prep + clue cells (+ odor)  Assessment & Plan:   Problem List Items Addressed This Visit   None Visit Diagnoses     Bacterial vaginosis    -  Primary   Relevant Medications   metroNIDAZOLE (FLAGYL) 500 MG tablet   Chlamydia       Relevant Medications   metroNIDAZOLE (FLAGYL) 500 MG tablet   Other Relevant Orders   C. trachomatis/N. gonorrhoeae RNA   Amenorrhea       Relevant Orders   Pregnancy, urine   Vaginal odor       Relevant Orders   WET PREP FOR [place], [place], CLUE   Encounter for initial prescription of vaginal ring hormonal contraceptive       Relevant  Medications   etonogestrel-ethinyl estradiol (NUVARING) 0.12-0.015 MG/24HR vaginal ring      Plan: UPT negative. GC/chlamydia pending. Wet prep positive for clue cells - Flagyl 500 mg BID x 7 days. Boric acid suppositories twice weekly.   Contraceptive options were reviewed, including hormonal methods, both combination (pill, patch, vaginal ring) and progesterone-only (pill, Depo Provera and Nexplanon), intrauterine devices (Mirena, [place], [place], and [place]), Phexxi, barrier methods (condoms, diaphragm) and female/female sterilization. The mechanisms, risks, benefits and side effects of all methods were discussed. She would like to do vaginal ring.      Tamela Gammon DNP, 12:38 PM 07/02/2021

## 2021-07-03 LAB — C. TRACHOMATIS/N. GONORRHOEAE RNA
C. trachomatis RNA, TMA: NOT DETECTED
N. gonorrhoeae RNA, TMA: NOT DETECTED

## 2021-08-11 ENCOUNTER — Encounter (INDEPENDENT_AMBULATORY_CARE_PROVIDER_SITE_OTHER): Payer: Self-pay

## 2021-09-02 ENCOUNTER — Encounter: Payer: Self-pay | Admitting: Nurse Practitioner

## 2021-09-02 ENCOUNTER — Ambulatory Visit (INDEPENDENT_AMBULATORY_CARE_PROVIDER_SITE_OTHER): Payer: Commercial Managed Care - HMO | Admitting: Nurse Practitioner

## 2021-09-02 VITALS — BP 108/62 | HR 94

## 2021-09-02 DIAGNOSIS — B3731 Acute candidiasis of vulva and vagina: Secondary | ICD-10-CM

## 2021-09-02 DIAGNOSIS — Z30015 Encounter for initial prescription of vaginal ring hormonal contraceptive: Secondary | ICD-10-CM

## 2021-09-02 DIAGNOSIS — N898 Other specified noninflammatory disorders of vagina: Secondary | ICD-10-CM

## 2021-09-02 DIAGNOSIS — Z113 Encounter for screening for infections with a predominantly sexual mode of transmission: Secondary | ICD-10-CM | POA: Diagnosis not present

## 2021-09-02 LAB — WET PREP FOR TRICH, YEAST, CLUE

## 2021-09-02 MED ORDER — ETONOGESTREL-ETHINYL ESTRADIOL 0.12-0.015 MG/24HR VA RING: 1.0000 | VAGINAL_RING | VAGINAL | 1 refills | Status: DC

## 2021-09-02 MED ORDER — FLUCONAZOLE 150 MG PO TABS: 150.0000 mg | ORAL_TABLET | ORAL | 0 refills | Status: DC

## 2021-09-02 NOTE — Progress Notes (Signed)
   Acute Office Visit  Subjective:    Patient ID: Sonya Hatfield, female    DOB: [birth year], 26 y.o.   MRN: [id number]   HPI 26 y.o. presents today for thick, white vaginal discharge with intermittent itching x 1 week. Would like STD screening today. Prescribed Nuvaring in June but she never picked up. She would like new prescription.    Review of Systems  Constitutional: Negative.   Genitourinary:  Positive for vaginal discharge.       Vaginal itching       Objective:    Physical Exam Constitutional:      Appearance: Normal appearance.  Genitourinary:    General: Normal vulva.     Vagina: Vaginal discharge and erythema present.     Cervix: Normal.     BP 108/62   Pulse 94   LMP  (Approximate) Comment: 07/2021  SpO2 97%  Wt Readings from Last 3 Encounters:  05/12/21 218 lb (98.9 kg)  10/06/20 207 lb (93.9 kg)  09/21/20 205 lb (93 kg)        Patient informed chaperone available to be present for breast and/or pelvic exam. Patient has requested no chaperone to be present. Patient has been advised what will be completed during breast and pelvic exam.   Wet prep + yeast  Assessment & Plan:   Problem List Items Addressed This Visit   None Visit Diagnoses     Screen for STD (sexually transmitted disease)    -  Primary   Relevant Orders   C. trachomatis/N. gonorrhoeae RNA   Encounter for initial prescription of vaginal ring hormonal contraceptive       Relevant Medications   etonogestrel-ethinyl estradiol (NUVARING) 0.12-0.015 MG/24HR vaginal ring   Vaginal itching       Relevant Orders   WET PREP FOR [place], YEAST, CLUE   Vaginal candidiasis       Relevant Medications   fluconazole (DIFLUCAN) 150 MG tablet      Plan: Wet prep positive for yeast - Diflucan 150 mg today and repeat in 3 days for total of 2 doses. Gonorrhea/chlamydia pending. New prescription for Nuvaring sent. Will start with next menses.      Tamela Gammon DNP, 3:55 PM 09/02/2021

## 2021-09-03 LAB — C. TRACHOMATIS/N. GONORRHOEAE RNA
C. trachomatis RNA, TMA: NOT DETECTED
N. gonorrhoeae RNA, TMA: NOT DETECTED

## 2021-09-12 IMAGING — DX DG CHEST 2V
2 series · 2 of 2 positions shown · non-contrast
Comparison: 02/20/2004

CLINICAL DATA: Shortness of breath

EXAM:
CHEST - 2 VIEW

[chest pa]
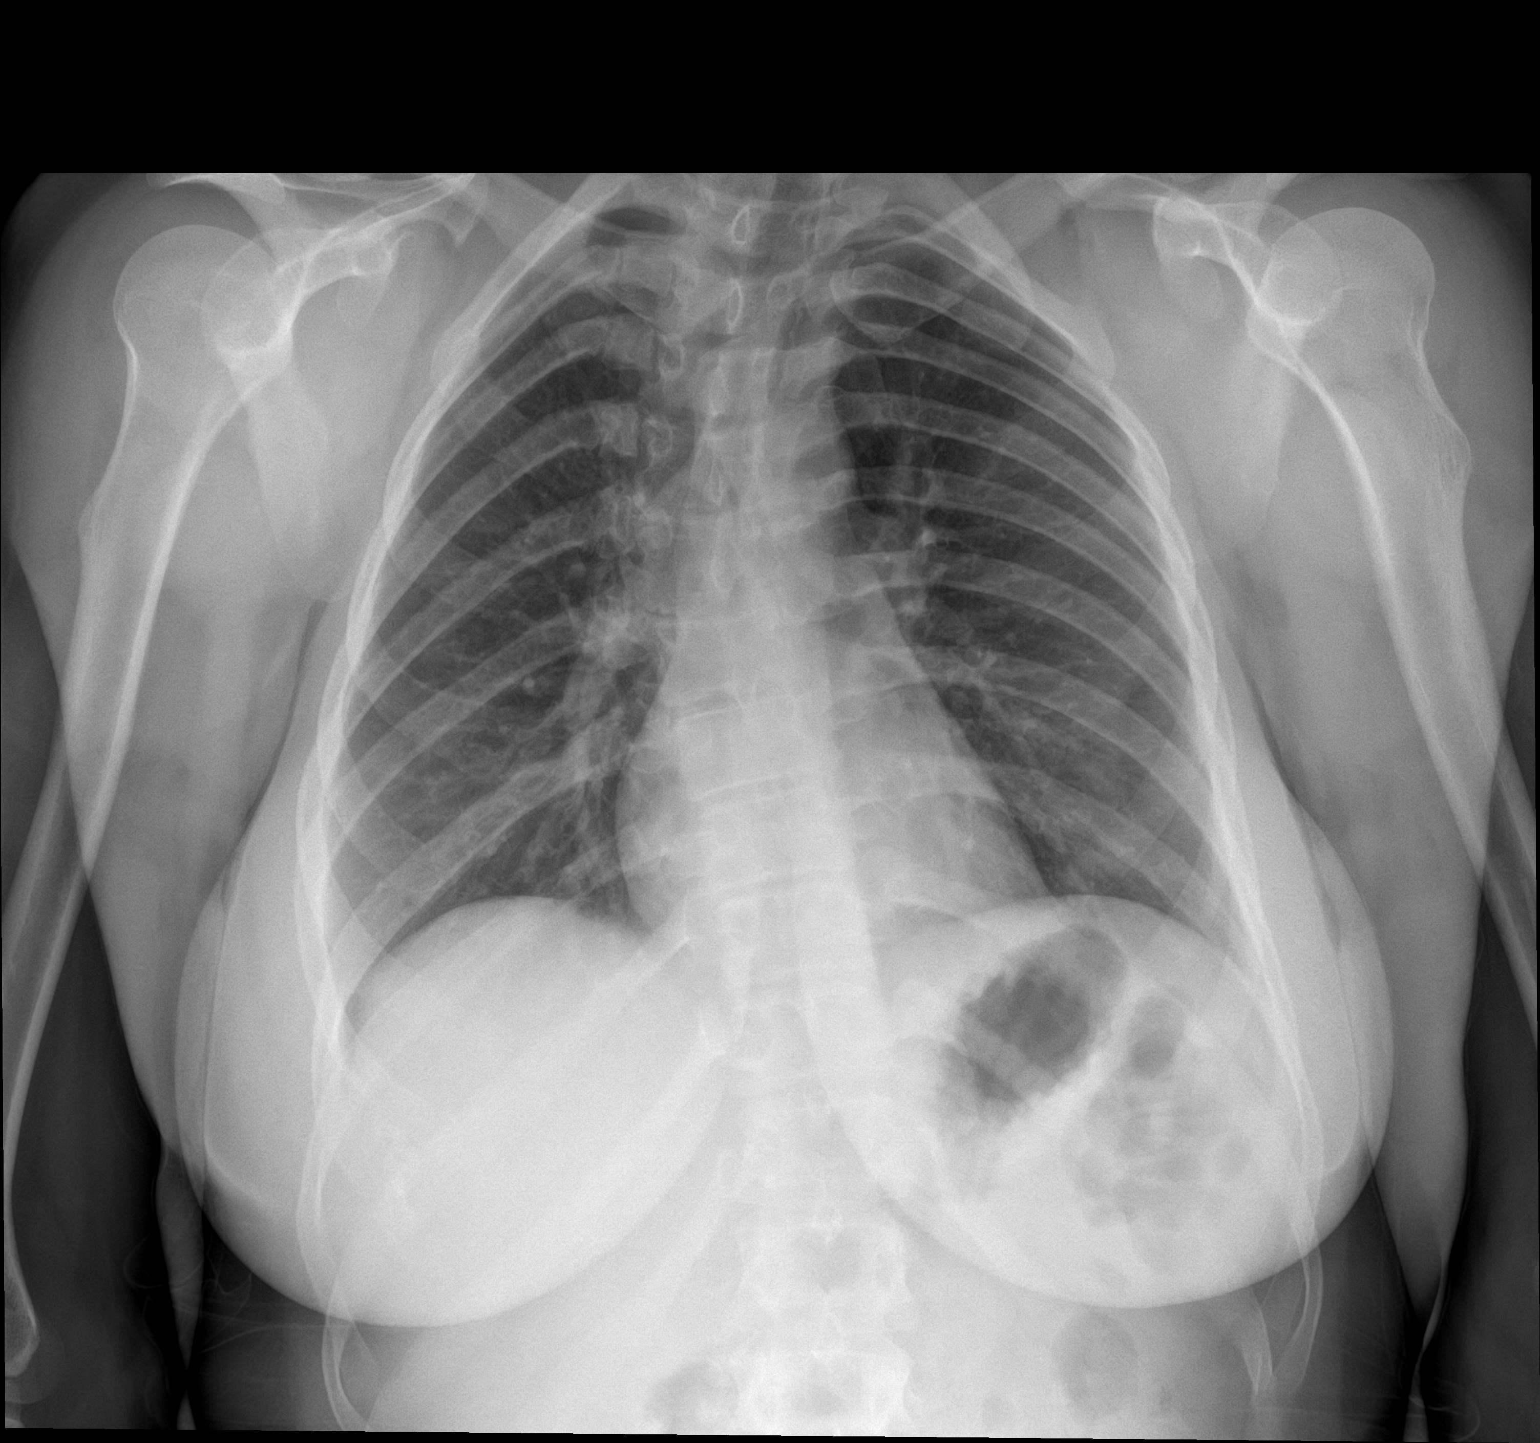

[chest lat]
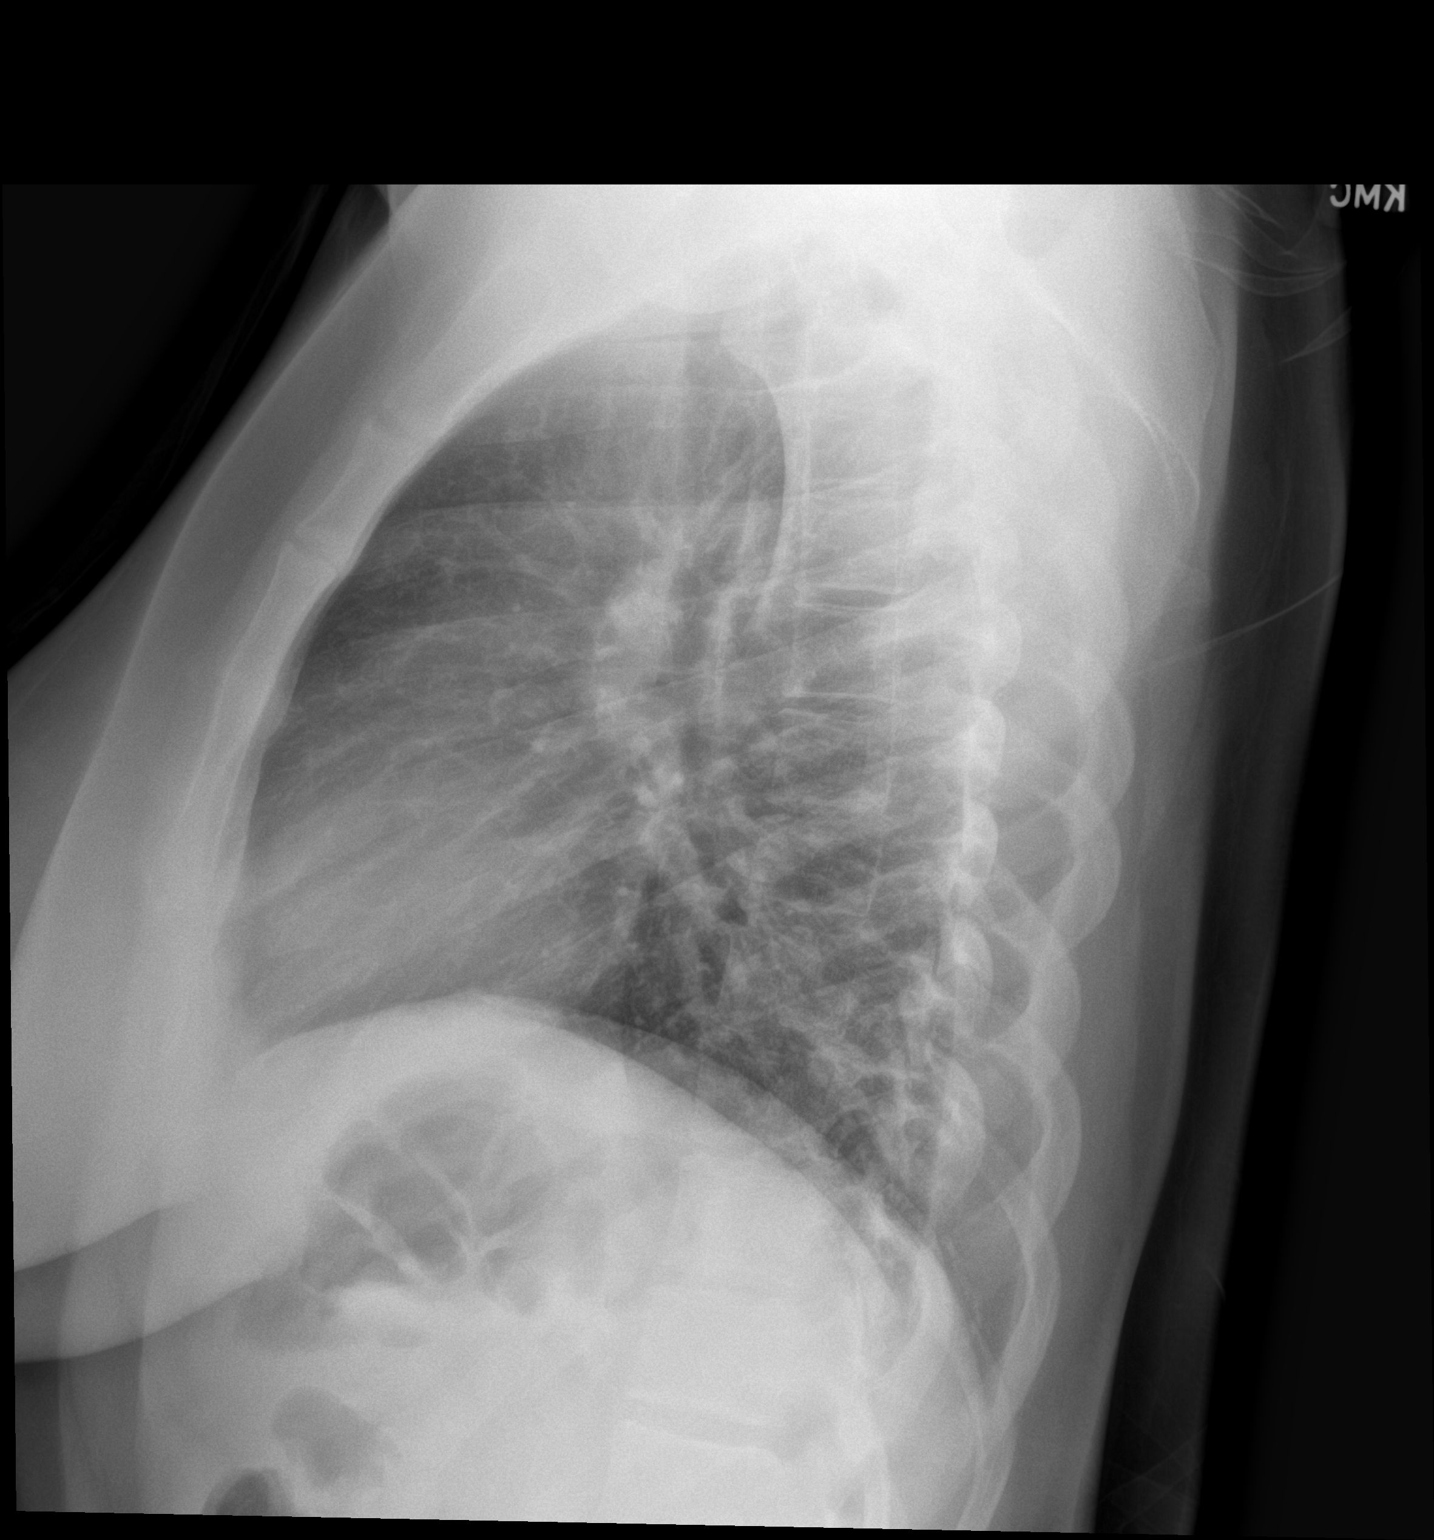

[2 of 2 positions shown; findings below may reference images not displayed]

FINDINGS: The heart size and mediastinal contours are within normal limits.
Both lungs are clear. The visualized skeletal structures are
unremarkable.
IMPRESSION: No active cardiopulmonary disease.

## 2021-12-02 ENCOUNTER — Ambulatory Visit (INDEPENDENT_AMBULATORY_CARE_PROVIDER_SITE_OTHER): Payer: Commercial Managed Care - HMO | Admitting: Nurse Practitioner

## 2021-12-02 ENCOUNTER — Encounter: Payer: Self-pay | Admitting: Nurse Practitioner

## 2021-12-02 VITALS — BP 98/54 | HR 68 | Resp 14 | Ht 64.0 in | Wt 214.0 lb

## 2021-12-02 DIAGNOSIS — N898 Other specified noninflammatory disorders of vagina: Secondary | ICD-10-CM

## 2021-12-02 DIAGNOSIS — N760 Acute vaginitis: Secondary | ICD-10-CM | POA: Diagnosis not present

## 2021-12-02 DIAGNOSIS — B9689 Other specified bacterial agents as the cause of diseases classified elsewhere: Secondary | ICD-10-CM | POA: Diagnosis not present

## 2021-12-02 DIAGNOSIS — Z113 Encounter for screening for infections with a predominantly sexual mode of transmission: Secondary | ICD-10-CM | POA: Diagnosis not present

## 2021-12-02 LAB — WET PREP FOR TRICH, YEAST, CLUE

## 2021-12-02 MED ORDER — METRONIDAZOLE 500 MG PO TABS: 500.0000 mg | ORAL_TABLET | Freq: Two times a day (BID) | ORAL | 0 refills | Status: DC

## 2021-12-02 NOTE — Progress Notes (Signed)
   Acute Office Visit  Subjective:    Patient ID: Sonya Hatfield, female    DOB: [birth year], 26 y.o.   MRN: [id number]   HPI 26 y.o. presents today for vaginal discharge and odor x 3 days. Negative STD screening in August. Would like screening done today.    Review of Systems  Constitutional: Negative.   Genitourinary:  Positive for vaginal discharge.       Objective:    Physical Exam Constitutional:      Appearance: Normal appearance.  Genitourinary:    General: Normal vulva.     Vagina: Vaginal discharge present. No erythema.     Cervix: Normal.     BP (!) 98/54   Pulse 68   Resp 14   Ht '5\' 4"'$  (1.626 m)   Wt 214 lb (97.1 kg)   LMP 11/18/2021   BMI 36.73 kg/m  Wt Readings from Last 3 Encounters:  12/02/21 214 lb (97.1 kg)  05/12/21 218 lb (98.9 kg)  10/06/20 207 lb (93.9 kg)        Patient informed chaperone available to be present for breast and/or pelvic exam. Patient has requested no chaperone to be present. Patient has been advised what will be completed during breast and pelvic exam.   Wet prep + clue cells (+ odor)  Assessment & Plan:   Problem List Items Addressed This Visit   None Visit Diagnoses     Bacterial vaginosis    -  Primary   Relevant Medications   metroNIDAZOLE (FLAGYL) 500 MG tablet   Vaginal discharge       Relevant Orders   C. trachomatis/N. gonorrhoeae RNA   WET PREP FOR [place], YEAST, CLUE   Screening examination for STD (sexually transmitted disease)       Relevant Orders   C. trachomatis/N. gonorrhoeae RNA      Plan: Wet prep positive for clue cells - Flagyl 500 mg BID x 7 days. STD panel pending.      Tamela Gammon DNP, 9:38 AM 12/02/2021

## 2021-12-03 LAB — C. TRACHOMATIS/N. GONORRHOEAE RNA
C. trachomatis RNA, TMA: NOT DETECTED
N. gonorrhoeae RNA, TMA: NOT DETECTED

## 2021-12-05 ENCOUNTER — Ambulatory Visit (HOSPITAL_COMMUNITY)
Admission: EM | Admit: 2021-12-05 | Discharge: 2021-12-05 | Disposition: A | Discharge status: Home / Self Care | Payer: Commercial Managed Care - HMO | Attending: Family Medicine | Admitting: Family Medicine

## 2021-12-05 ENCOUNTER — Encounter (HOSPITAL_COMMUNITY): Payer: Self-pay

## 2021-12-05 DIAGNOSIS — U071 COVID-19: Secondary | ICD-10-CM | POA: Diagnosis not present

## 2021-12-05 DIAGNOSIS — J069 Acute upper respiratory infection, unspecified: Secondary | ICD-10-CM | POA: Diagnosis not present

## 2021-12-05 DIAGNOSIS — R112 Nausea with vomiting, unspecified: Secondary | ICD-10-CM | POA: Diagnosis not present

## 2021-12-05 DIAGNOSIS — R197 Diarrhea, unspecified: Secondary | ICD-10-CM | POA: Insufficient documentation

## 2021-12-05 LAB — RESP PANEL BY RT-PCR (FLU A&B, COVID) ARPGX2
Influenza A by PCR: NEGATIVE
Influenza B by PCR: NEGATIVE
SARS Coronavirus 2 by RT PCR: POSITIVE — AB

## 2021-12-05 MED ORDER — ONDANSETRON 4 MG PO TBDP: 4.0000 mg | ORAL_TABLET | Freq: Three times a day (TID) | ORAL | 0 refills | Status: DC | PRN

## 2021-12-05 MED ORDER — DICYCLOMINE HCL 20 MG PO TABS: 20.0000 mg | ORAL_TABLET | Freq: Four times a day (QID) | ORAL | 0 refills | Status: DC | PRN

## 2021-12-05 MED ORDER — NAPROXEN 500 MG PO TABS: 500.0000 mg | ORAL_TABLET | Freq: Two times a day (BID) | ORAL | 0 refills | Status: DC | PRN
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
## 2021-12-05 NOTE — ED Triage Notes (Signed)
Pt is here for abdominal pain, nausea, diarrhea, headache , back pain nasal congestion, runny nose ,  body aches and chills x 2days

## 2021-12-05 NOTE — Discharge Instructions (Signed)
Dicyclomine--take 1 every 6 hours as needed for intestinal cramps  Ondansetron dissolved in the mouth every 8 hours as needed for nausea or vomiting. Clear liquids and bland things to eat.  Take naproxen 500 mg--1 tablet every 12 hours as needed for pain   You have been swabbed for COVID and flu, and the test will result in the next 24 hours. Our staff will call you if positive. If the COVID test is positive, you should quarantine for 5 days from the start of your symptoms

## 2021-12-06 ENCOUNTER — Telehealth (HOSPITAL_COMMUNITY): Payer: Self-pay | Admitting: Emergency Medicine

## 2021-12-06 MED ORDER — NIRMATRELVIR/RITONAVIR (PAXLOVID)TABLET: 3.0000 | ORAL_TABLET | Freq: Two times a day (BID) | ORAL | 0 refills | Status: AC

## 2022-01-04 ENCOUNTER — Encounter: Payer: Self-pay | Admitting: Nurse Practitioner

## 2022-01-04 ENCOUNTER — Ambulatory Visit (INDEPENDENT_AMBULATORY_CARE_PROVIDER_SITE_OTHER): Payer: Commercial Managed Care - HMO | Admitting: Nurse Practitioner

## 2022-01-04 VITALS — BP 128/82 | HR 65 | Wt 214.0 lb

## 2022-01-04 DIAGNOSIS — N898 Other specified noninflammatory disorders of vagina: Secondary | ICD-10-CM | POA: Diagnosis not present

## 2022-01-04 DIAGNOSIS — B9689 Other specified bacterial agents as the cause of diseases classified elsewhere: Secondary | ICD-10-CM

## 2022-01-04 DIAGNOSIS — B3731 Acute candidiasis of vulva and vagina: Secondary | ICD-10-CM | POA: Diagnosis not present

## 2022-01-04 DIAGNOSIS — N760 Acute vaginitis: Secondary | ICD-10-CM

## 2022-01-04 LAB — WET PREP FOR TRICH, YEAST, CLUE

## 2022-01-04 MED ORDER — FLUCONAZOLE 150 MG PO TABS: 150.0000 mg | ORAL_TABLET | ORAL | 0 refills | Status: DC

## 2022-01-04 MED ORDER — METRONIDAZOLE 500 MG PO TABS: 500.0000 mg | ORAL_TABLET | Freq: Two times a day (BID) | ORAL | 0 refills | Status: DC

## 2022-01-04 NOTE — Progress Notes (Signed)
   Acute Office Visit  Subjective:    Patient ID: Sonya Hatfield, female    DOB: 03-04-1995, 27 y.o.   MRN: [id number]   HPI 27 y.o. presents today for vaginal odor. Treated for BV 12/02/21 but reports not finishing full course. She took a couple doses yesterday and feels symptoms have improved some. Negative STD screening 12/02/21.    Review of Systems  Constitutional: Negative.   Genitourinary:        Vaginal odor       Objective:    Physical Exam Constitutional:      Appearance: Normal appearance.  Genitourinary:    General: Normal vulva.     Vagina: Vaginal discharge present. No erythema.     Cervix: Normal.     BP 128/82   Pulse 65   Wt 214 lb (97.1 kg)   LMP 12/27/2021   SpO2 100%   BMI 36.73 kg/m  Wt Readings from Last 3 Encounters:  01/04/22 214 lb (97.1 kg)  12/02/21 214 lb (97.1 kg)  05/12/21 218 lb (98.9 kg)        Patient informed chaperone available to be present for breast and/or pelvic exam. Patient has requested no chaperone to be present. Patient has been advised what will be completed during breast and pelvic exam.   Wet prep + yeast, + clue cells (+ odor)  Assessment & Plan:   Problem List Items Addressed This Visit   None Visit Diagnoses     Bacterial vaginosis    -  Primary   Relevant Medications   fluconazole (DIFLUCAN) 150 MG tablet   metroNIDAZOLE (FLAGYL) 500 MG tablet   Vaginal odor       Relevant Orders   WET PREP FOR TRICH, YEAST, CLUE   Vaginal candidiasis       Relevant Medications   fluconazole (DIFLUCAN) 150 MG tablet   metroNIDAZOLE (FLAGYL) 500 MG tablet      Plan: Wet prep positive for clue cells and yeast. Diflucan 150 mg to day and repeat in 3 days for total of 2 doses. Flagyl 500 mg BID x 7 days. Discussed importance of completing full course for eradication.      Tamela Gammon DNP, 12:31 PM 01/04/2022

## 2022-01-06 ENCOUNTER — Other Ambulatory Visit: Payer: Self-pay | Admitting: Nurse Practitioner

## 2022-01-06 ENCOUNTER — Telehealth: Payer: Self-pay

## 2022-01-06 DIAGNOSIS — Z3044 Encounter for surveillance of vaginal ring hormonal contraceptive device: Secondary | ICD-10-CM

## 2022-01-06 MED ORDER — ETONOGESTREL-ETHINYL ESTRADIOL 0.12-0.015 MG/24HR VA RING: 1.0000 | VAGINAL_RING | VAGINAL | 1 refills | Status: DC

## 2022-01-06 NOTE — Telephone Encounter (Signed)
Patient was in office on 01/04/22 and states that birth control pill Rx was to be sent for her and it is not at the pharmacy.

## 2022-01-06 NOTE — Telephone Encounter (Signed)
Nuvaring sent. Thanks.

## 2022-01-07 ENCOUNTER — Other Ambulatory Visit: Payer: Self-pay | Admitting: Nurse Practitioner

## 2022-01-07 DIAGNOSIS — Z30011 Encounter for initial prescription of contraceptive pills: Secondary | ICD-10-CM

## 2022-01-07 MED ORDER — NORETHIN ACE-ETH ESTRAD-FE 1-20 MG-MCG PO TABS: 1.0000 | ORAL_TABLET | Freq: Every day | ORAL | 1 refills | Status: DC

## 2022-01-07 NOTE — Telephone Encounter (Signed)
Ok thanks for clarifying. OCPs are fine. I do not see where she has been on them before, so please educate on proper use and recommended start time depending on LMP.

## 2022-01-07 NOTE — Telephone Encounter (Signed)
Call returned to patient.  Patient states she was requesting RX for OCP. States she discussed with CMA in office but not provider. LMP 12/27/21. No contraceptive currently.   Advised patient I will send to provider to review request and f/u with recommendations. Patient agreeable.   Tiffany -please review and advise if OCP appropriate.

## 2022-01-07 NOTE — Telephone Encounter (Signed)
Spoke with patient, advised per Sonya Hatfield. Instructed on OCP use and start. AEX scheduled for 05/16/22.  Patient verbalizes understanding and is agreeable.   Encounter closed.

## 2022-01-07 NOTE — Telephone Encounter (Signed)
Prescription sent to [facility].

## 2022-04-05 ENCOUNTER — Ambulatory Visit (INDEPENDENT_AMBULATORY_CARE_PROVIDER_SITE_OTHER): Payer: Commercial Managed Care - HMO | Admitting: Radiology

## 2022-04-05 VITALS — BP 112/64

## 2022-04-05 DIAGNOSIS — N760 Acute vaginitis: Secondary | ICD-10-CM | POA: Diagnosis not present

## 2022-04-05 DIAGNOSIS — Z113 Encounter for screening for infections with a predominantly sexual mode of transmission: Secondary | ICD-10-CM

## 2022-04-05 LAB — WET PREP FOR TRICH, YEAST, CLUE

## 2022-04-05 MED ORDER — METRONIDAZOLE 0.75 % VA GEL: 1.0000 | Freq: Every day | VAGINAL | 0 refills | Status: AC
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
## 2022-04-06 ENCOUNTER — Ambulatory Visit: Payer: Commercial Managed Care - HMO | Admitting: Family

## 2022-04-06 LAB — SURESWAB CT/NG/T. VAGINALIS
C. trachomatis RNA, TMA: NOT DETECTED
N. gonorrhoeae RNA, TMA: NOT DETECTED
Trichomonas vaginalis RNA: NOT DETECTED
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
## 2022-04-20 ENCOUNTER — Ambulatory Visit: Payer: Commercial Managed Care - HMO | Admitting: Family

## 2022-05-04 ENCOUNTER — Ambulatory Visit: Payer: Commercial Managed Care - HMO | Admitting: Nurse Practitioner

## 2022-05-16 ENCOUNTER — Ambulatory Visit: Payer: Commercial Managed Care - HMO | Admitting: Nurse Practitioner

## 2022-05-17 ENCOUNTER — Ambulatory Visit: Payer: Commercial Managed Care - HMO | Admitting: Nurse Practitioner

## 2022-06-15 ENCOUNTER — Ambulatory Visit (INDEPENDENT_AMBULATORY_CARE_PROVIDER_SITE_OTHER): Payer: Commercial Managed Care - HMO | Admitting: Radiology

## 2022-06-15 VITALS — BP 100/64

## 2022-06-15 DIAGNOSIS — N760 Acute vaginitis: Secondary | ICD-10-CM

## 2022-06-15 DIAGNOSIS — N912 Amenorrhea, unspecified: Secondary | ICD-10-CM

## 2022-06-15 DIAGNOSIS — N3090 Cystitis, unspecified without hematuria: Secondary | ICD-10-CM

## 2022-06-15 DIAGNOSIS — R8290 Unspecified abnormal findings in urine: Secondary | ICD-10-CM | POA: Diagnosis not present

## 2022-06-15 DIAGNOSIS — Z113 Encounter for screening for infections with a predominantly sexual mode of transmission: Secondary | ICD-10-CM

## 2022-06-15 LAB — WET PREP FOR TRICH, YEAST, CLUE

## 2022-06-15 MED ORDER — METRONIDAZOLE 0.75 % VA GEL
1.0000 | Freq: Every day | VAGINAL | 0 refills | Status: AC
Start: 2022-06-15 — End: 2022-06-20

## 2022-06-15 MED ORDER — NITROFURANTOIN MONOHYD MACRO 100 MG PO CAPS
100.0000 mg | ORAL_CAPSULE | Freq: Two times a day (BID) | ORAL | 0 refills | Status: DC
Start: 2022-06-15 — End: 2022-07-19
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
## 2022-06-16 LAB — SURESWAB CT/NG/T. VAGINALIS
C. trachomatis RNA, TMA: NOT DETECTED
N. gonorrhoeae RNA, TMA: NOT DETECTED
Trichomonas vaginalis RNA: NOT DETECTED

## 2022-06-16 LAB — URINE CULTURE: SPECIMEN QUALITY:: ADEQUATE

## 2022-06-17 LAB — URINALYSIS, COMPLETE W/RFL CULTURE
Bilirubin Urine: NEGATIVE
Glucose, UA: NEGATIVE
Hyaline Cast: NONE SEEN /LPF
Ketones, ur: NEGATIVE
Nitrites, Initial: POSITIVE — AB
RBC / HPF: >=60 /HPF — AB (ref 0–2)
Specific Gravity, Urine: 1.025 (ref 1.001–1.035)
pH: 7 (ref 5.0–8.0)

## 2022-06-17 LAB — URINE CULTURE: MICRO NUMBER:: 15073002

## 2022-06-17 LAB — PREGNANCY, URINE: Preg Test, Ur: NEGATIVE

## 2022-06-17 LAB — CULTURE INDICATED

## 2022-07-06 ENCOUNTER — Ambulatory Visit: Payer: Commercial Managed Care - HMO | Admitting: Radiology

## 2022-07-19 ENCOUNTER — Encounter (HOSPITAL_COMMUNITY): Payer: Self-pay

## 2022-07-19 ENCOUNTER — Ambulatory Visit (INDEPENDENT_AMBULATORY_CARE_PROVIDER_SITE_OTHER): Payer: Self-pay

## 2022-07-19 ENCOUNTER — Ambulatory Visit (HOSPITAL_COMMUNITY)
Admission: EM | Admit: 2022-07-19 | Discharge: 2022-07-19 | Disposition: A | Discharge status: Home / Self Care | Payer: Self-pay | Attending: Family Medicine | Admitting: Family Medicine

## 2022-07-19 DIAGNOSIS — S92355A Nondisplaced fracture of fifth metatarsal bone, left foot, initial encounter for closed fracture: Secondary | ICD-10-CM

## 2022-07-19 DIAGNOSIS — M25572 Pain in left ankle and joints of left foot: Secondary | ICD-10-CM

## 2022-07-19 MED ORDER — TRAMADOL HCL 50 MG PO TABS: 50.0000 mg | ORAL_TABLET | Freq: Four times a day (QID) | ORAL | 0 refills | Status: DC | PRN

## 2022-07-19 MED ORDER — NAPROXEN 500 MG PO TABS: 500.0000 mg | ORAL_TABLET | Freq: Two times a day (BID) | ORAL | 0 refills | Status: DC | PRN

## 2022-07-19 NOTE — ED Triage Notes (Signed)
Patient here today with c/o left foot and ankle injury 9 days ago. Patient was walking down the stairs and slipped on a toy that was at the bottom of the steps. She fell onto her left foot. Increased pain with weightbearing. She has been taking Motrin with minimal relief.

## 2022-07-19 NOTE — Discharge Instructions (Signed)
There is a fracture of your fifth metatarsal.  Take tramadol 50 mg-- 1 tablet every 6 hours as needed for pain.  This medication can make you sleepy or dizzy  Take naproxen 500 mg--1 tablet every 12 hours as needed for pain
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
## 2022-07-21 ENCOUNTER — Encounter: Payer: Self-pay | Admitting: Nurse Practitioner

## 2022-07-21 ENCOUNTER — Ambulatory Visit: Payer: Commercial Managed Care - HMO | Admitting: Nurse Practitioner

## 2022-07-21 NOTE — Progress Notes (Deleted)
   Sonya Hatfield [birth year] [id number]   History:  27 y.o. G1P1001 presents for annual exam. Monthly cycles. COCs? Normal pap history. Negative STD screening 06/15/22.   Gynecologic History Patient's last menstrual period was 07/17/2022 (exact date).   Contraception/Family planning: {method:5051} Sexually active: ***  Health Maintenance Last Pap: 05/12/2021. Results were: Normal Last mammogram: Not indicated Last colonoscopy: Not indicated Last Dexa: Not indicated   Past medical history, past surgical history, family history and social history were all reviewed and documented in the EPIC chart.  ROS:  A ROS was performed and pertinent positives and negatives are included.  Exam:  There were no vitals filed for this visit. There is no height or weight on file to calculate BMI.  General appearance:  Normal Thyroid:  Symmetrical, normal in size, without palpable masses or nodularity. Respiratory  Auscultation:  Clear without wheezing or rhonchi Cardiovascular  Auscultation:  Regular rate, without rubs, murmurs or gallops  Edema/varicosities:  Not grossly evident Abdominal  Soft,nontender, without masses, guarding or rebound.  Liver/spleen:  No organomegaly noted  Hernia:  None appreciated  Skin  Inspection:  Grossly normal Breasts: Examined lying and sitting.   Right: Without masses, retractions, nipple discharge or axillary adenopathy.   Left: Without masses, retractions, nipple discharge or axillary adenopathy. Genitourinary   Inguinal/mons:  Normal without inguinal adenopathy  External genitalia:  Normal appearing vulva with no masses, tenderness, or lesions  BUS/Urethra/Skene's glands:  Normal  Vagina:  Normal appearing with normal color and discharge, no lesions  Cervix:  Normal appearing without discharge or lesions  Uterus:  Normal in size, shape and contour.  Midline and mobile, nontender  Adnexa/parametria:     Rt: Normal in size, without masses or  tenderness.   Lt: Normal in size, without masses or tenderness.  Anus and perineum: Normal  Digital rectal exam: Deferred  Patient informed chaperone available to be present for breast and pelvic exam. Patient has requested no chaperone to be present. Patient has been advised what will be completed during breast and pelvic exam.   Assessment/Plan:  27 y.o. G1P1001 for annual exam.    Return in 1 year for annual or sooner if needed.   [name] DNP, 2:55 PM 07/21/2022

## 2022-11-15 ENCOUNTER — Ambulatory Visit: Payer: Managed Care, Other (non HMO) | Admitting: Podiatry

## 2023-01-03 ENCOUNTER — Ambulatory Visit: Payer: Managed Care, Other (non HMO) | Admitting: Podiatry

## 2023-01-06 ENCOUNTER — Ambulatory Visit: Payer: Commercial Managed Care - HMO | Admitting: Obstetrics and Gynecology

## 2023-01-06 ENCOUNTER — Encounter: Payer: Self-pay | Admitting: Obstetrics and Gynecology

## 2023-01-06 ENCOUNTER — Telehealth: Payer: Self-pay

## 2023-01-06 VITALS — BP 102/68

## 2023-01-06 DIAGNOSIS — B3731 Acute candidiasis of vulva and vagina: Secondary | ICD-10-CM | POA: Diagnosis not present

## 2023-01-06 DIAGNOSIS — N898 Other specified noninflammatory disorders of vagina: Secondary | ICD-10-CM

## 2023-01-06 MED ORDER — FLUCONAZOLE 150 MG PO TABS: 150.0000 mg | ORAL_TABLET | Freq: Once | ORAL | 1 refills | Status: AC

## 2023-01-06 NOTE — Telephone Encounter (Signed)
 Pt sent mychart msg to appt desk:  Hey, I got my times mixed up for my appointment this morning and was having trouble getting into my chart. I got through earlier asking for a reschedule, I was sent to the nurse line to see if there was any way I could be filled in today or if an opening came available for me to be contacted. No one answered the nurse line and now I can't get through at all. Can someone please call me back, thank you in advance!   Appt was scheduled for this AM w/ EB @ 8 for discomfort but no triage msg seen re: this in pt's chart.  Spoke w/ the pt and she reports having a vulvar discomfort w/ itch.   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 But denies much discharge & no odor or urinary troubles.   States she doesn't wish to go through the weekend feeling this way.  Please advise.

## 2023-01-06 NOTE — Progress Notes (Signed)
   Acute Office Visit  Subjective:    Patient ID: Sonya Hatfield, female    DOB: 12-May-1995, 28 y.o.   MRN: [id number]   HPI 28 y.o. presents today for Vaginitis (Complains of external vulvar itching.  Symptoms began 01/03/23.) Worse on left labia. No new partners. Denies any abnormal discharge.  Patient's last menstrual period was 12/04/2022 (exact date).    Review of Systems     Objective:    OBGyn Exam  BP 102/68 (BP Location: Left Arm, Patient Position: Sitting, Cuff Size: Large)   LMP 12/04/2022 (Exact Date)  Wt Readings from Last 3 Encounters:  07/19/22 204 lb (92.5 kg)  01/04/22 214 lb (97.1 kg)  12/02/21 214 lb (97.1 kg)      SVE: discharge c/w yeast. Swab collected   Patient informed chaperone available to be present for breast and/or pelvic exam. Patient has requested no chaperone to be present. Patient has been advised what will be completed during breast and pelvic exam.   Assessment & Plan:  Vaginal yeast infection Diflucan  sent in.  Repeat dose in 48 hours with persistent symptoms.  RTC with any persistent or worsening s/s.   Sonya Hatfield MARLA Carpen

## 2023-01-06 NOTE — Telephone Encounter (Signed)
"  Can she come at 1130?"   Spoke w/ the pt and she said that she is on her way, will likely be 1140 but coming. She voiced appreciation.

## 2023-01-07 LAB — SURESWAB® ADVANCED VAGINITIS PLUS,TMA
C. trachomatis RNA, TMA: NOT DETECTED
CANDIDA SPECIES: DETECTED — AB
Candida glabrata: NOT DETECTED
N. gonorrhoeae RNA, TMA: NOT DETECTED
SURESWAB(R) ADV BACTERIAL VAGINOSIS(BV),TMA: NEGATIVE
TRICHOMONAS VAGINALIS (TV),TMA: NOT DETECTED

## 2023-01-09 ENCOUNTER — Ambulatory Visit: Payer: Commercial Managed Care - HMO | Admitting: Nurse Practitioner

## 2023-01-12 ENCOUNTER — Ambulatory Visit: Payer: Managed Care, Other (non HMO) | Admitting: Podiatry

## 2023-01-16 ENCOUNTER — Ambulatory Visit (INDEPENDENT_AMBULATORY_CARE_PROVIDER_SITE_OTHER): Payer: Commercial Managed Care - HMO

## 2023-01-16 ENCOUNTER — Encounter: Payer: Self-pay | Admitting: Podiatry

## 2023-01-16 ENCOUNTER — Ambulatory Visit: Payer: Commercial Managed Care - HMO | Admitting: Podiatry

## 2023-01-16 VITALS — Ht 64.0 in | Wt 204.0 lb

## 2023-01-16 DIAGNOSIS — S92355A Nondisplaced fracture of fifth metatarsal bone, left foot, initial encounter for closed fracture: Secondary | ICD-10-CM | POA: Diagnosis not present

## 2023-01-16 DIAGNOSIS — M778 Other enthesopathies, not elsewhere classified: Secondary | ICD-10-CM

## 2023-01-18 NOTE — Progress Notes (Signed)
 Subjective:   Patient ID: Sonya Hatfield, female   DOB: 28 y.o.   MRN: [id number]   HPI Patient has history of fracture of the left fifth metatarsal that she still has discomfort with it and she is concerned about nonhealing of the area and does not smoke and likes to be active   Review of Systems  All other systems reviewed and are negative.       Objective:  Physical Exam Vitals and nursing note reviewed.  Constitutional:      Appearance: She is well-developed.  Pulmonary:     Effort: Pulmonary effort is normal.  Musculoskeletal:        General: Normal range of motion.  Skin:    General: Skin is warm.  Neurological:     Mental Status: She is alert.     Neurovascular status was found to be intact muscle strength was found to be within normal limits with patient noted to have inflammation and pain of a moderate nature base of fifth metatarsal left approximate 3 months after injury.  Patient has good digital perfusion well-oriented     Assessment:  Jones fracture left with inflammation fluid buildup     Plan:  H&P reviewed and I have recommended continued ice anti-inflammatories as needed and it should heal uneventfully in comparison to previous x-rays.  All questions answered concerning Joshua and if it does become a problem may require surgery long-term  X-rays indicate that the fracture is healing and no longer has the pathology of the initial x-rays with still slight healing to go

## 2023-02-24 ENCOUNTER — Other Ambulatory Visit: Payer: Self-pay | Admitting: Nurse Practitioner

## 2023-02-24 DIAGNOSIS — Z30011 Encounter for initial prescription of contraceptive pills: Secondary | ICD-10-CM

## 2023-02-24 NOTE — Telephone Encounter (Signed)
 Med refill request:Larin fe 1/20 mg-mcg tablet  Last AEX: 05/12/21  Next AEX: not scheduled, message has been sent to scheduling department about schedlung the pt an appointment for Aex. Last MMG (if hormonal med) n/a  Refill authorized: Last rx- 01/08/23 #84 with 1 refill. Please approve or deny as appropriate.   Called and spoke with pt, per patient she is still taking her birth control and she was unaware that another office had removed it off of her medication list. She does want the refill for this but she would also like to speak with and or schedule an appointment to come in and talk about different birth control options.// JM, CMA.

## 2023-03-02 ENCOUNTER — Ambulatory Visit: Payer: Commercial Managed Care - HMO | Admitting: Obstetrics and Gynecology

## 2023-03-07 ENCOUNTER — Ambulatory Visit: Payer: Commercial Managed Care - HMO | Admitting: Radiology

## 2023-03-07 VITALS — BP 114/64 | Wt 204.0 lb

## 2023-03-07 DIAGNOSIS — N898 Other specified noninflammatory disorders of vagina: Secondary | ICD-10-CM

## 2023-03-07 DIAGNOSIS — Z113 Encounter for screening for infections with a predominantly sexual mode of transmission: Secondary | ICD-10-CM

## 2023-03-07 DIAGNOSIS — N760 Acute vaginitis: Secondary | ICD-10-CM

## 2023-03-07 DIAGNOSIS — B9689 Other specified bacterial agents as the cause of diseases classified elsewhere: Secondary | ICD-10-CM

## 2023-03-07 LAB — WET PREP FOR TRICH, YEAST, CLUE

## 2023-03-07 MED ORDER — NUVESSA 1.3 % VA GEL
1.0000 | Freq: Once | VAGINAL | 0 refills | Status: AC
Start: 2023-03-07 — End: 2023-03-07

## 2023-03-07 MED ORDER — FLUCONAZOLE 150 MG PO TABS: 150.0000 mg | ORAL_TABLET | ORAL | 0 refills | Status: DC
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
## 2023-03-08 LAB — SURESWAB CT/NG/T. VAGINALIS
C. trachomatis RNA, TMA: NOT DETECTED
N. gonorrhoeae RNA, TMA: NOT DETECTED
Trichomonas vaginalis RNA: NOT DETECTED

## 2023-03-28 ENCOUNTER — Ambulatory Visit: Admitting: Nurse Practitioner

## 2023-03-28 ENCOUNTER — Encounter: Payer: Self-pay | Admitting: Nurse Practitioner

## 2023-03-28 VITALS — BP 126/82

## 2023-03-28 DIAGNOSIS — N898 Other specified noninflammatory disorders of vagina: Secondary | ICD-10-CM

## 2023-03-28 DIAGNOSIS — N760 Acute vaginitis: Secondary | ICD-10-CM

## 2023-03-28 DIAGNOSIS — B9689 Other specified bacterial agents as the cause of diseases classified elsewhere: Secondary | ICD-10-CM

## 2023-03-28 LAB — WET PREP FOR TRICH, YEAST, CLUE

## 2023-03-28 MED ORDER — METRONIDAZOLE 500 MG PO TABS: 500.0000 mg | ORAL_TABLET | Freq: Two times a day (BID) | ORAL | 0 refills | Status: DC

## 2023-03-28 NOTE — Progress Notes (Signed)
   Acute Office Visit  Subjective:    Patient ID: Sonya Hatfield, female    DOB: Nov 15, 1995, 28 y.o.   MRN: [id number]   HPI 28 y.o. presents today for vaginal discharge and odor x 2 days. H/O recurrent BV. Treated for BV 04/05/22, 06/15/22, yeast 01/06/23, yeast and BV 03/07/23. Was doing probiotics but ran out a while back. Did boric acid a long time ago. Thinks it may be related to her menses. Using dove sensitive soap.   Patient's last menstrual period was 03/18/2023 (exact date).    Review of Systems  Constitutional: Negative.   Genitourinary:  Positive for vaginal discharge.       Vaginal odor       Objective:    Physical Exam Constitutional:      Appearance: Normal appearance.  Genitourinary:    General: Normal vulva.     Vagina: Vaginal discharge and erythema present.     Cervix: Normal.     BP 126/82 (BP Location: Left Arm, Patient Position: Sitting, Cuff Size: Small)   LMP 03/18/2023 (Exact Date)  Wt Readings from Last 3 Encounters:  03/07/23 204 lb (92.5 kg)  01/16/23 204 lb (92.5 kg)  07/19/22 204 lb (92.5 kg)        Patient informed chaperone available to be present for breast and/or pelvic exam. Patient has requested no chaperone to be present. Patient has been advised what will be completed during breast and pelvic exam.   Wet prep + clue cells (+ odor)   Assessment & Plan:   Problem List Items Addressed This Visit   None Visit Diagnoses       Bacterial vaginosis    -  Primary   Relevant Medications   metroNIDAZOLE (FLAGYL) 500 MG tablet     Vaginal discharge       Relevant Orders   WET PREP FOR TRICH, YEAST, CLUE     Recurrent vaginitis       Relevant Orders   Mycoplasma / ureaplasma culture      Plan: Wet prep + clue cells - Flagyl 500 mg BID x 7 days. Restart women's probiotic, boric acid twice weekly after completing current treatment. Ureaplasma/mycoplasma pending. Discussed option to do prn Metrogel with menses if no improvement.    Return if symptoms worsen or fail to improve.    Sonya Mackie DNP, 9:45 AM 03/28/2023

## 2023-03-30 ENCOUNTER — Ambulatory Visit: Admitting: Radiology

## 2023-03-31 ENCOUNTER — Ambulatory Visit: Payer: Commercial Managed Care - HMO | Admitting: Radiology

## 2023-04-03 ENCOUNTER — Other Ambulatory Visit: Payer: Self-pay | Admitting: Nurse Practitioner

## 2023-04-03 DIAGNOSIS — Z30011 Encounter for initial prescription of contraceptive pills: Secondary | ICD-10-CM

## 2023-04-03 LAB — MYCOPLASMA / UREAPLASMA CULTURE

## 2023-04-03 NOTE — Telephone Encounter (Signed)
 Patient left message on triage line requesting refill.   Med refill request:Larin Fe 1/20 Last AEX: 05/12/21 -JC Next AEX: 04/26/23 -JC Last MMG (if hormonal med) N/A Refill authorized: Please Advise?   Spoke with patient. Took last pill today. Advised will send to covering provider for review for 1 mo supply, will have to keep AEX scheduled for future refills. Patient agreeable.   Routing to covering provider, Tiffany for review.

## 2023-04-03 NOTE — Telephone Encounter (Signed)
 Duplicate. See previous request dated 04/03/23.   Encounter closed.

## 2023-04-04 ENCOUNTER — Encounter: Payer: Self-pay | Admitting: Nurse Practitioner

## 2023-04-07 ENCOUNTER — Other Ambulatory Visit: Payer: Self-pay | Admitting: Radiology

## 2023-04-07 ENCOUNTER — Encounter: Payer: Self-pay | Admitting: Nurse Practitioner

## 2023-04-07 DIAGNOSIS — N760 Acute vaginitis: Secondary | ICD-10-CM

## 2023-04-07 MED ORDER — FLUCONAZOLE 150 MG PO TABS: 150.0000 mg | ORAL_TABLET | ORAL | 0 refills | Status: DC

## 2023-04-13 ENCOUNTER — Encounter: Payer: Self-pay | Admitting: Nurse Practitioner

## 2023-04-13 ENCOUNTER — Ambulatory Visit: Admitting: Nurse Practitioner

## 2023-04-13 VITALS — BP 124/76 | HR 89

## 2023-04-13 DIAGNOSIS — N898 Other specified noninflammatory disorders of vagina: Secondary | ICD-10-CM

## 2023-04-13 DIAGNOSIS — L292 Pruritus vulvae: Secondary | ICD-10-CM | POA: Diagnosis not present

## 2023-04-13 LAB — WET PREP FOR TRICH, YEAST, CLUE

## 2023-04-13 NOTE — Progress Notes (Signed)
   Acute Office Visit  Subjective:    Patient ID: Sonya Hatfield, female    DOB: [birth year], 28 y.o.   MRN: [id number]   HPI 28 y.o. presents today for vaginal itching and discharge. Treated for BV 03/28/2023. Felt symptoms fully went away and then developed what feels like yeast symptoms. Itching is external, mild, intermittent. Discharge is better. Took Diflucan x 2, last dose 3 days ago. On her menses now. Has not been sexually active. H/O recurrent BV.   Patient's last menstrual period was 03/18/2023 (exact date).    Review of Systems  Constitutional: Negative.   Genitourinary:  Positive for vaginal discharge and vaginal pain (Itching).       Objective:    Physical Exam Constitutional:      Appearance: Normal appearance.  Genitourinary:    Vagina: Bleeding present.     Cervix: Normal.     Comments: Mild external redness near introitus    BP 124/76 (BP Location: Left Arm, Patient Position: Sitting, Cuff Size: Small)   Pulse 89   LMP 03/18/2023 (Exact Date)   SpO2 98%  Wt Readings from Last 3 Encounters:  03/07/23 204 lb (92.5 kg)  01/16/23 204 lb (92.5 kg)  07/19/22 204 lb (92.5 kg)        Patient informed chaperone available to be present for breast and/or pelvic exam. Patient has requested no chaperone to be present. Patient has been advised what will be completed during breast and pelvic exam.   Wet prep negative for pathogens  Assessment & Plan:   Problem List Items Addressed This Visit   None Visit Diagnoses       Vulvar itching    -  Primary   Relevant Orders   WET PREP FOR TRICH, YEAST, CLUE      Plan: Negative wet prep. Irritation likely residual from yeast infection. Will monitor.   Return if symptoms worsen or fail to improve.    [name] DNP, 1:51 PM 04/13/2023

## 2023-04-26 ENCOUNTER — Encounter: Payer: Self-pay | Admitting: Radiology

## 2023-04-26 ENCOUNTER — Ambulatory Visit (INDEPENDENT_AMBULATORY_CARE_PROVIDER_SITE_OTHER): Admitting: Radiology

## 2023-04-26 VITALS — BP 100/60 | Ht 65.25 in | Wt 196.0 lb

## 2023-04-26 DIAGNOSIS — Z01419 Encounter for gynecological examination (general) (routine) without abnormal findings: Secondary | ICD-10-CM

## 2023-04-26 DIAGNOSIS — Z3041 Encounter for surveillance of contraceptive pills: Secondary | ICD-10-CM | POA: Diagnosis not present

## 2023-04-26 DIAGNOSIS — Z1331 Encounter for screening for depression: Secondary | ICD-10-CM

## 2023-04-26 MED ORDER — NORETHIN ACE-ETH ESTRAD-FE 1-20 MG-MCG PO TABS
1.0000 | ORAL_TABLET | Freq: Every day | ORAL | 4 refills | Status: DC
Start: 2023-04-26 — End: 2023-07-24

## 2023-04-26 NOTE — Patient Instructions (Signed)

## 2023-04-26 NOTE — Progress Notes (Signed)
 Sonya Hatfield [birth year] [id number]   History:  28 y.o. G1P1 presents for annual exam. No new gyn concerns. Taking a probiotic and using boric acid to prevent vaginal infections. Uses OCPs to skip cycles.  Gynecologic History Patient's last menstrual period was 04/09/2023 (exact date). Period Cycle (Days): 28 Period Duration (Days): 7 Period Pattern: Regular Menstrual Flow: Heavy, Moderate, Light Menstrual Control: Tampon Dysmenorrhea: (!) Moderate Dysmenorrhea Symptoms: Cramping Contraception/Family planning: OCP (estrogen/progesterone) Sexually active: yes Last Pap: 2023 normal  Obstetric History OB History  Gravida Para Term Preterm AB Living  1 1 1   1   SAB IAB Ectopic Multiple Live Births      1    # Outcome Date GA Lbr Len/2nd Weight Sex Type Anes PTL Lv  1 Term 12/10/17        LIV       04/26/2023   10:21 AM 07/16/2020    8:43 AM  Depression screen PHQ 2/9  Decreased Interest 0 3  Down, Depressed, Hopeless 0 1  PHQ - 2 Score 0 4  Altered sleeping  0  Tired, decreased energy  3  Change in appetite  0  Feeling bad or failure about yourself   0  Trouble concentrating  0  Moving slowly or fidgety/restless  0  Suicidal thoughts  0  PHQ-9 Score  7     The following portions of the patient's history were reviewed and updated as appropriate: allergies, current medications, past family history, past medical history, past social history, past surgical history, and problem list.  Review of Systems  All other systems reviewed and are negative.   Past medical history, past surgical history, family history and social history were all reviewed and documented in the EPIC chart.  Exam:  Vitals:   04/26/23 1020  BP: 100/60  Weight: 196 lb (88.9 kg)  Height: 5' 5.25" (1.657 m)   Body mass index is 32.37 kg/m.  Physical Exam Vitals and nursing note reviewed. Exam conducted with a chaperone present.  Constitutional:      Appearance: Normal appearance. She is  normal weight.  HENT:     Head: Normocephalic and atraumatic.  Neck:     Thyroid: No thyroid mass, thyromegaly or thyroid tenderness.  Cardiovascular:     Rate and Rhythm: Regular rhythm.     Heart sounds: Normal heart sounds.  Pulmonary:     Effort: Pulmonary effort is normal.     Breath sounds: Normal breath sounds.  Chest:  Breasts:    Breasts are symmetrical.     Right: Normal. No inverted nipple, mass, nipple discharge, skin change or tenderness.     Left: Normal. No inverted nipple, mass, nipple discharge, skin change or tenderness.  Abdominal:     General: Abdomen is flat. Bowel sounds are normal.     Palpations: Abdomen is soft.  Genitourinary:    General: Normal vulva.     Vagina: Normal. No vaginal discharge, bleeding or lesions.     Cervix: Normal. No discharge or lesion.     Uterus: Normal. Not enlarged and not tender.      Adnexa: Right adnexa normal and left adnexa normal.       Right: No mass, tenderness or fullness.         Left: No mass, tenderness or fullness.    Lymphadenopathy:     Upper Body:     Right upper body: No axillary adenopathy.     Left upper body: No axillary adenopathy.  Skin:  General: Skin is warm and dry.  Neurological:     Mental Status: She is alert and oriented to person, place, and time.  Psychiatric:        Mood and Affect: Mood normal.        Thought Content: Thought content normal.        Judgment: Judgment normal.      Ellis Guys, CMA present for exam  Assessment/Plan:   1. Well woman exam with routine gynecological exam (Primary) Pap 2026  2. Oral contraceptive pill surveillance - norethindrone-ethinyl estradiol -FE (LARIN FE 1/20) 1-20 MG-MCG tablet; Take 1 tablet by mouth daily. Take continuously  Dispense: 84 tablet; Refill: 4     Return in about 1 year (around 04/25/2024) for Annual.  Laine Piggs WHNP-BC 10:33 AM 04/26/2023

## 2023-05-31 ENCOUNTER — Telehealth: Admitting: Physician Assistant

## 2023-05-31 DIAGNOSIS — J069 Acute upper respiratory infection, unspecified: Secondary | ICD-10-CM

## 2023-05-31 NOTE — Patient Instructions (Signed)
 Sonya Hatfield, thank you for joining Hyla Maillard, PA-C for today's virtual visit.  While this provider is not your primary care provider (PCP), if your PCP is located in our provider database this encounter information will be shared with them immediately following your visit.   A [facility] MyChart account gives you access to today's visit and all your visits, tests, and labs performed at [facility] " click here if you don't have a [facility] MyChart account or go to [email or web address]  Consent: (Patient) Sonya Hatfield provided verbal consent for this virtual visit at the beginning of the encounter.  Current Medications:  Current Outpatient Medications:    norethindrone-ethinyl estradiol -FE (LARIN FE 1/20) 1-20 MG-MCG tablet, Take 1 tablet by mouth daily. Take continuously, Disp: 84 tablet, Rfl: 4 No current facility-administered medications for this visit.  Facility-Administered Medications Ordered in Other Visits:    gadopentetate dimeglumine  (MAGNEVIST ) injection 15 mL, 15 mL, Intravenous, Once PRN, Athar, Saima, MD   Medications ordered in this encounter:  No orders of the defined types were placed in this encounter.    *If you need refills on other medications prior to your next appointment, please contact your pharmacy*  Follow-Up: Call back or seek an in-person evaluation if the symptoms worsen or if the condition fails to improve as anticipated.  [facility] [phone]  Other Instructions Upper Respiratory Infection, Adult An upper respiratory infection (URI) affects the nose, throat, and upper airways that lead to the lungs. The most common type of URI is often called the common cold. URIs usually get better on their own, without medical treatment. What are the causes? A URI is caused by a germ (virus). You may catch these germs by: Breathing in droplets from an infected person's cough or sneeze. Touching something that has  the germ on it (is contaminated) and then touching your mouth, nose, or eyes. What increases the risk? You are more likely to get a URI if: You are very young or very old. You have close contact with others, such as at work, school, or a health care facility. You smoke. You have long-term (chronic) heart or lung disease. You have a weakened disease-fighting system (immune system). You have nasal allergies or asthma. You have a lot of stress. You have poor nutrition. What are the signs or symptoms? Runny or stuffy (congested) nose. Cough. Sneezing. Sore throat. Headache. Feeling tired (fatigue). Fever. Not wanting to eat as much as usual. Pain in your forehead, behind your eyes, and over your cheekbones (sinus pain). Muscle aches. Redness or irritation of the eyes. Pressure in the ears or face. How is this treated? URIs usually get better on their own within 7-10 days. Medicines cannot cure URIs, but your doctor may recommend certain medicines to help relieve symptoms, such as: Over-the-counter cold medicines. Medicines to reduce coughing (cough suppressants). Coughing is a type of defense against infection that helps to clear the nose, throat, windpipe, and lungs (respiratory system). Take these medicines only as told by your doctor. Medicines to lower your fever. Follow these instructions at home: Activity Rest as needed. If you have a fever, stay home from work or school until your fever is gone, or until your doctor says you may return to work or school. You should stay home until you cannot spread the infection anymore (you are not contagious). Your doctor may have you wear a face mask so you have less risk of spreading the infection. Relieving symptoms Rinse your mouth  often with salt water. To make salt water, dissolve -1 tsp (3-6 g) of salt in 1 cup (237 mL) of warm water. Use a cool-mist humidifier to add moisture to the air. This can help you breathe more  easily. Eating and drinking  Drink enough fluid to keep your pee (urine) pale yellow. Eat soups and other clear broths. General instructions  Take over-the-counter and prescription medicines only as told by your doctor. Do not smoke or use any products that contain nicotine or tobacco. If you need help quitting, ask your doctor. Avoid being where people are smoking (avoid secondhand smoke). Stay up to date on all your shots (immunizations), and get the flu shot every year. Keep all follow-up visits. How to prevent the spread of infection to others  Wash your hands with soap and water for at least 20 seconds. If you cannot use soap and water, use hand sanitizer. Avoid touching your mouth, face, eyes, or nose. Cough or sneeze into a tissue or your sleeve or elbow. Do not cough or sneeze into your hand or into the air. Contact a doctor if: You are getting worse, not better. You have any of these: A fever or chills. Brown or red mucus in your nose. Yellow or brown fluid (discharge)coming from your nose. Pain in your face, especially when you bend forward. Swollen neck glands. Pain when you swallow. White areas in the back of your throat. Get help right away if: You have shortness of breath that gets worse. You have very bad or constant: Headache. Ear pain. Pain in your forehead, behind your eyes, and over your cheekbones (sinus pain). Chest pain. You have long-lasting (chronic) lung disease along with any of these: Making high-pitched whistling sounds when you breathe, most often when you breathe out (wheezing). Long-lasting cough (more than 14 days). Coughing up blood. A change in your usual mucus. You have a stiff neck. You have changes in your: Vision. Hearing. Thinking. Mood. These symptoms may be an emergency. Get help right away. Call 911. Do not wait to see if the symptoms will go away. Do not drive yourself to the hospital. Summary An upper respiratory infection  (URI) is caused by a germ (virus). The most common type of URI is often called the common cold. URIs usually get better within 7-10 days. Take over-the-counter and prescription medicines only as told by your doctor. This information is not intended to replace advice given to you by your health care provider. Make sure you discuss any questions you have with your health care provider. Document Revised: 07/22/2020 Document Reviewed: 07/22/2020 Elsevier Patient Education  2024 Elsevier Inc.   If you have been instructed to have an in-person evaluation today at a local Urgent Care facility, please use the link below. It will take you to a list of all of our available [facility], including address, phone number and hours of operation. Please do not delay care.  [facility]  If you or a family member do not have a primary care provider, use the link below to schedule a visit and establish care. When you choose a [facility] primary care physician or advanced practice provider, you gain a long-term partner in health. Find a Primary Care Provider  Learn more about [facility]'s in-office and virtual care options:  - Get Care Now

## 2023-05-31 NOTE — Progress Notes (Signed)
 Virtual Visit Consent   Sonya Hatfield, you are scheduled for a virtual visit with a [facility] provider today. Just as with appointments in the office, your consent must be obtained to participate. Your consent will be active for this visit and any virtual visit you may have with one of our providers in the next 365 days. If you have a MyChart account, a copy of this consent can be sent to you electronically.  As this is a virtual visit, video technology does not allow for your provider to perform a traditional examination. This may limit your provider's ability to fully assess your condition. If your provider identifies any concerns that need to be evaluated in person or the need to arrange testing (such as labs, EKG, etc.), we will make arrangements to do so. Although advances in technology are sophisticated, we cannot ensure that it will always work on either your end or our end. If the connection with a video visit is poor, the visit may have to be switched to a telephone visit. With either a video or telephone visit, we are not always able to ensure that we have a secure connection.  By engaging in this virtual visit, you consent to the provision of healthcare and authorize for your insurance to be billed (if applicable) for the services provided during this visit. Depending on your insurance coverage, you may receive a charge related to this service.  I need to obtain your verbal consent now. Are you willing to proceed with your visit today? Sonya Hatfield has provided verbal consent on 05/31/2023 for a virtual visit (video or telephone). Hyla Maillard, [place]  Date: 05/31/2023 12:11 PM   Virtual Visit via Video Note   I, Hyla Maillard, connected with  Sonya Hatfield  ([id number], 12-Oct-1995) on 05/31/23 at 12:15 PM EDT by a video-enabled telemedicine application and verified that I am speaking with the correct person using two identifiers.  Location: Patient: Virtual Visit Location  Patient: Home Provider: Virtual Visit Location Provider: Home Office   I discussed the limitations of evaluation and management by telemedicine and the availability of in person appointments. The patient expressed understanding and agreed to proceed.    History of Present Illness: Sonya Hatfield is a 28 y.o. who identifies as a female who was assigned female at birth, and is being seen today for URI symptoms starting over the weekend with scratchy throat and voice hoarseness with some mild congestion. Yesterday with headache, nasal congestion, body aches, and fatigue. Notes today feeling much better with resolution of aches, fatigue, headache, sore throat. Still with some residual nasal congestion -- mainly clear.   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 Take continuously, Disp: 84 tablet, Rfl: 4 No current facility-administered medications for this visit.  Facility-Administered Medications Ordered in Other Visits:    gadopentetate dimeglumine  (MAGNEVIST ) injection 15 mL, 15 mL, Intravenous, Once PRN, Athar, Saima, MD  Observations/Objective: Patient is well-developed, well-nourished in no acute distress.  Resting comfortably at home.  Head is normocephalic, atraumatic.  No labored breathing. Speech is clear and coherent with logical content.  Patient is alert and oriented at baseline.   Assessment and Plan: 1. Viral URI with cough (Primary)  Supportive measures and OTC medications reviewed. Recommended OTC antihistamine-decongestant combo and Flonase nasal spray. Improving on  its own already so this should continue. Reassurance given.   Follow Up Instructions: I discussed the assessment and treatment plan with the patient. The patient was provided an opportunity to ask questions and all were answered. The patient agreed with the plan and demonstrated an understanding of the instructions.  A copy of instructions were sent to the patient via MyChart unless otherwise noted below.   The patient was advised to call back or seek an in-person evaluation if the symptoms worsen or if the condition fails to improve as anticipated.    Hyla Maillard, PA-C

## 2023-06-19 ENCOUNTER — Ambulatory Visit: Payer: Self-pay | Admitting: Family

## 2023-07-13 ENCOUNTER — Ambulatory Visit: Admitting: Nurse Practitioner

## 2023-07-13 ENCOUNTER — Encounter: Payer: Self-pay | Admitting: Nurse Practitioner

## 2023-07-13 VITALS — BP 118/78 | HR 64 | Ht 64.0 in | Wt 201.2 lb

## 2023-07-13 DIAGNOSIS — N912 Amenorrhea, unspecified: Secondary | ICD-10-CM | POA: Diagnosis not present

## 2023-07-13 DIAGNOSIS — Z3009 Encounter for other general counseling and advice on contraception: Secondary | ICD-10-CM | POA: Diagnosis not present

## 2023-07-13 LAB — PREGNANCY, URINE: Preg Test, Ur: NEGATIVE

## 2023-07-13 NOTE — Progress Notes (Signed)
   Acute Office Visit  Subjective:    Patient ID: Sonya Hatfield, female    DOB: 1995-01-16, 28 y.o.   MRN: [id number]   HPI 28 y.o. G1P1001 presents today for amenorrhea. LMP 05/03/2023. Stopped COCs sometime in May. Has not had any bleeding since. Has been on and off birth controls for a while but reports being regular when not on birth control, so no history of irregular periods. Two neg UPTs at home. Sexually active infrequently, in long distance relationship.   Patient's last menstrual period was 05/03/2023 (approximate). Period Cycle (Days): 28 Period Duration (Days): 4 - 5 Period Pattern: Regular Menstrual Flow: Moderate Menstrual Control: Tampon Menstrual Control Change Freq (Hours): 4 hrs Dysmenorrhea: (!) Mild Dysmenorrhea Symptoms: Cramping  Review of Systems  Constitutional: Negative.   Genitourinary:  Positive for menstrual problem.       Objective:    Physical Exam Constitutional:      Appearance: Normal appearance.   GU: Not indicated  BP 118/78   Pulse 64   Ht 5' 4 (1.626 m)   Wt 201 lb 3.2 oz (91.3 kg)   LMP 05/03/2023 (Approximate)   SpO2 98%   BMI 34.54 kg/m  Wt Readings from Last 3 Encounters:  07/13/23 201 lb 3.2 oz (91.3 kg)  04/26/23 196 lb (88.9 kg)  03/07/23 204 lb (92.5 kg)          UPT negative  Assessment & Plan:   Problem List Items Addressed This Visit   None Visit Diagnoses       Amenorrhea    -  Primary   Relevant Orders   Pregnancy, urine     General counseling and advice on female contraception          Plan: Reassured may take 3-6 months for menses to regulate after stopping birth control. If no menses by end of this month she is interested in doing Provera to bring on a period. Wants to get back on birth control and is interested in ring but wants to wait until she gets her period. Bled with Nexplanon  and COCs, not interested in Depo or IUD. Will reach out when she is ready. Neg UPT today.   Return if symptoms  worsen or fail to improve.    Sonya DELENA Shutter DNP, 12:38 PM 07/13/2023

## 2023-07-21 DIAGNOSIS — K5904 Chronic idiopathic constipation: Secondary | ICD-10-CM | POA: Insufficient documentation

## 2023-07-24 ENCOUNTER — Ambulatory Visit: Admitting: Family Medicine

## 2023-07-24 VITALS — BP 104/62 | HR 67 | Ht 64.0 in | Wt 201.0 lb

## 2023-07-24 DIAGNOSIS — Z13220 Encounter for screening for lipoid disorders: Secondary | ICD-10-CM

## 2023-07-24 DIAGNOSIS — E66811 Obesity, class 1: Secondary | ICD-10-CM | POA: Diagnosis not present

## 2023-07-24 DIAGNOSIS — Z6834 Body mass index (BMI) 34.0-34.9, adult: Secondary | ICD-10-CM | POA: Diagnosis not present

## 2023-07-24 DIAGNOSIS — E559 Vitamin D deficiency, unspecified: Secondary | ICD-10-CM | POA: Diagnosis not present

## 2023-07-24 DIAGNOSIS — Z0000 Encounter for general adult medical examination without abnormal findings: Secondary | ICD-10-CM

## 2023-07-24 DIAGNOSIS — Z1159 Encounter for screening for other viral diseases: Secondary | ICD-10-CM

## 2023-07-24 DIAGNOSIS — K5904 Chronic idiopathic constipation: Secondary | ICD-10-CM

## 2023-07-24 LAB — COMPREHENSIVE METABOLIC PANEL WITH GFR
ALT: 17 U/L (ref 0–35)
AST: 18 U/L (ref 0–37)
Albumin: 4.3 g/dL (ref 3.5–5.2)
Alkaline Phosphatase: 50 U/L (ref 39–117)
BUN: 16 mg/dL (ref 6–23)
CO2: 29 meq/L (ref 19–32)
Calcium: 9.2 mg/dL (ref 8.4–10.5)
Chloride: 102 meq/L (ref 96–112)
Creatinine, Ser: 0.75 mg/dL (ref 0.40–1.20)
GFR: 108.95 mL/min (ref >60.00)
Glucose, Bld: 88 mg/dL (ref 70–99)
Potassium: 4.3 meq/L (ref 3.5–5.1)
Sodium: 137 meq/L (ref 135–145)
Total Bilirubin: 0.5 mg/dL (ref 0.2–1.2)
Total Protein: 6.6 g/dL (ref 6.0–8.3)

## 2023-07-24 LAB — CBC WITH DIFFERENTIAL/PLATELET
Basophils Absolute: 0 K/uL (ref 0.0–0.1)
Basophils Relative: 0.4 % (ref 0.0–3.0)
Eosinophils Absolute: 0.1 K/uL (ref 0.0–0.7)
Eosinophils Relative: 3.1 % (ref 0.0–5.0)
HCT: 39.5 % (ref 36.0–46.0)
Hemoglobin: 12.8 g/dL (ref 12.0–15.0)
Lymphocytes Relative: 48.2 % — ABNORMAL HIGH (ref 12.0–46.0)
Lymphs Abs: 1.9 K/uL (ref 0.7–4.0)
MCHC: 32.5 g/dL (ref 30.0–36.0)
MCV: 86.8 fl (ref 78.0–100.0)
Monocytes Absolute: 0.3 K/uL (ref 0.1–1.0)
Monocytes Relative: 8.1 % (ref 3.0–12.0)
Neutro Abs: 1.6 K/uL (ref 1.4–7.7)
Neutrophils Relative %: 40.2 % — ABNORMAL LOW (ref 43.0–77.0)
Platelets: 204 K/uL (ref 150.0–400.0)
RBC: 4.55 Mil/uL (ref 3.87–5.11)
RDW: 13.4 % (ref 11.5–15.5)
WBC: 3.9 K/uL — ABNORMAL LOW (ref 4.0–10.5)

## 2023-07-24 LAB — LIPID PANEL
Cholesterol: 167 mg/dL (ref 0–200)
HDL: 82.5 mg/dL (ref >39.00)
LDL Cholesterol: 74 mg/dL (ref 0–99)
NonHDL: 84.8
Total CHOL/HDL Ratio: 2
Triglycerides: 53 mg/dL (ref 0.0–149.0)
VLDL: 10.6 mg/dL (ref 0.0–40.0)

## 2023-07-24 LAB — TSH: TSH: 1.35 u[IU]/mL (ref 0.35–5.50)

## 2023-07-24 LAB — VITAMIN D 25 HYDROXY (VIT D DEFICIENCY, FRACTURES): VITD: 16.8 ng/mL — ABNORMAL LOW (ref 30.00–100.00)

## 2023-07-24 NOTE — Assessment & Plan Note (Signed)
 Labs today Healthy lifestyle encouraged
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
## 2023-07-24 NOTE — Patient Instructions (Signed)
 CONSTIPATION:  ALL THE TIME:  Lifestyle measures Hydration: drink plenty of water Physical activity: at least walking daily High-fiber foods Bulk forming laxatives  Psyllium (Konsyl; Metamucil; Perdiem) Methylcellulose (Citrucel) Calcium polycarbophil (FiberCon; Fiber-Lax; Mitrolan) Wheat dextrin (Benefiber)  Try adding a daily probiotic as ell - Florajen, Nature Made, Culturelle, etc   AS NEEDED FOR 3-5 DAYS AT A TIME OR ALL THE TIME 1-2 TIMES PER WEEK  (CAN TAKE ONE FROM EACH CATEGORY E.G. MIRALAX + SENNA) Hyperosmolar or saline laxatives: Polyethylene glycol (MiraLAX, GlycoLax) Lactulose Sorbitol Magnesium hydroxide (Milk of Magnesia)  Magnesium citrate (Evac-Q-Mag)  Stimulant laxatives Senna (eg, Black Draught, Ex-Lax, Fletcher's, Castoria, Senokot)  Bisacodyl (eg, Correctol, Doxidan, Dulcolax). Taking stimulant laxatives regularly or in large amounts can cause side effects, including low potassium levels. Thus, you should take these drugs carefully if you must use them regularly.  PRESCRIPTIONS that treat severe constipation. They are expensive, but may be recommended if you do not respond to other treatments. Lubiprostone (Amitiza) Linaclotide (Linzess) Plecanatide (Trulance, Motegrity) Tenapanor Teryl)

## 2023-07-24 NOTE — Assessment & Plan Note (Signed)
 Infrequent bowel movements without pain or bloating. Emphasized hydration, activity, and dietary fiber. Discussed fiber supplements and probiotics. - Advise daily intake of fiber supplements such as Metamucil or Benefiber. - Recommend daily probiotic such as Culturelle or Florajen. - PRN stool softeners. - Advise use of laxatives if no bowel movement by day three.

## 2023-07-24 NOTE — Assessment & Plan Note (Signed)
 Low vitamin D  levels not assessed in three years. Plan to re-evaluate levels with blood work. - Order blood work to assess current vitamin D  levels. - Consider restarting vitamin D  supplementation if levels are low.

## 2023-07-25 LAB — HEPATITIS C ANTIBODY: Hepatitis C Ab: NONREACTIVE

## 2023-07-26 ENCOUNTER — Ambulatory Visit: Payer: Self-pay | Admitting: Family Medicine

## 2023-07-26 DIAGNOSIS — E559 Vitamin D deficiency, unspecified: Secondary | ICD-10-CM

## 2023-07-26 MED ORDER — VITAMIN D (ERGOCALCIFEROL) 1.25 MG (50000 UNIT) PO CAPS
50000.0000 [IU] | ORAL_CAPSULE | ORAL | 0 refills | Status: AC
Start: 2023-07-26 — End: ?

## 2023-08-01 ENCOUNTER — Ambulatory Visit: Admitting: Radiology

## 2023-08-01 ENCOUNTER — Encounter: Payer: Self-pay | Admitting: Radiology

## 2023-08-01 VITALS — BP 118/70 | HR 95 | Ht 64.0 in | Wt 203.0 lb

## 2023-08-01 DIAGNOSIS — Z113 Encounter for screening for infections with a predominantly sexual mode of transmission: Secondary | ICD-10-CM

## 2023-08-01 DIAGNOSIS — N760 Acute vaginitis: Secondary | ICD-10-CM | POA: Diagnosis not present

## 2023-08-01 LAB — WET PREP FOR TRICH, YEAST, CLUE

## 2023-08-01 MED ORDER — FLUCONAZOLE 150 MG PO TABS: 150.0000 mg | ORAL_TABLET | ORAL | 0 refills | Status: DC
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
## 2023-08-02 LAB — SURESWAB CT/NG/T. VAGINALIS
C. trachomatis RNA, TMA: NOT DETECTED
N. gonorrhoeae RNA, TMA: NOT DETECTED
Trichomonas vaginalis RNA: NOT DETECTED

## 2023-08-03 ENCOUNTER — Ambulatory Visit: Payer: Self-pay | Admitting: Radiology

## 2023-08-08 ENCOUNTER — Ambulatory Visit
Admission: EM | Admit: 2023-08-08 | Discharge: 2023-08-08 | Disposition: A | Discharge status: Home / Self Care | Attending: Emergency Medicine | Admitting: Emergency Medicine

## 2023-08-08 DIAGNOSIS — H6123 Impacted cerumen, bilateral: Secondary | ICD-10-CM | POA: Diagnosis not present

## 2023-08-08 DIAGNOSIS — J029 Acute pharyngitis, unspecified: Secondary | ICD-10-CM | POA: Diagnosis not present

## 2023-08-08 LAB — POC SOFIA SARS ANTIGEN FIA: SARS Coronavirus 2 Ag: NEGATIVE

## 2023-08-08 LAB — POCT RAPID STREP A (OFFICE): Rapid Strep A Screen: NEGATIVE

## 2023-08-08 MED ORDER — IBUPROFEN 600 MG PO TABS: 600.0000 mg | ORAL_TABLET | Freq: Four times a day (QID) | ORAL | 0 refills | Status: DC | PRN
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
## 2023-08-08 NOTE — Discharge Instructions (Addendum)
 Rapid strep negative; throat culture pending.    Rapid COVID negative.  Take the ibuprofen  as directed.  Follow-up with your primary care provider if your symptoms are not improving.

## 2023-08-08 NOTE — ED Triage Notes (Addendum)
 Patient to Urgent Care with complaints of sore throat/ headaches/ body aches. Unknown if any fevers. Right sided ear fullness.   Symptoms x2 days.   Meds: Mucinex. No otc today.

## 2023-08-10 ENCOUNTER — Ambulatory Visit: Payer: Self-pay

## 2023-08-10 LAB — CULTURE, GROUP A STREP (THRC)

## 2023-08-10 MED ORDER — AMOXICILLIN 500 MG PO CAPS: 500.0000 mg | ORAL_CAPSULE | Freq: Two times a day (BID) | ORAL | 0 refills | Status: AC

## 2023-08-16 ENCOUNTER — Ambulatory Visit: Admitting: Family Medicine

## 2023-08-25 ENCOUNTER — Ambulatory Visit: Admitting: Radiology

## 2023-08-25 ENCOUNTER — Encounter: Payer: Self-pay | Admitting: Radiology

## 2023-08-25 VITALS — BP 112/74 | Wt 200.0 lb

## 2023-08-25 DIAGNOSIS — N760 Acute vaginitis: Secondary | ICD-10-CM | POA: Diagnosis not present

## 2023-08-25 DIAGNOSIS — Z113 Encounter for screening for infections with a predominantly sexual mode of transmission: Secondary | ICD-10-CM

## 2023-08-25 LAB — WET PREP FOR TRICH, YEAST, CLUE

## 2023-08-25 MED ORDER — NUVESSA 1.3 % VA GEL
5.0000 g | Freq: Once | VAGINAL | 0 refills | Status: AC
Start: 2023-08-25 — End: 2023-08-25
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
## 2023-08-25 NOTE — Patient Instructions (Signed)

## 2023-08-26 LAB — SURESWAB CT/NG/T. VAGINALIS
C. trachomatis RNA, TMA: NOT DETECTED
N. gonorrhoeae RNA, TMA: NOT DETECTED
Trichomonas vaginalis RNA: NOT DETECTED

## 2023-08-28 ENCOUNTER — Ambulatory Visit: Payer: Self-pay | Admitting: Radiology

## 2023-09-06 ENCOUNTER — Other Ambulatory Visit

## 2023-09-06 ENCOUNTER — Other Ambulatory Visit: Payer: Self-pay | Admitting: Radiology

## 2023-09-06 ENCOUNTER — Telehealth: Payer: Self-pay | Admitting: Family Medicine

## 2023-09-06 ENCOUNTER — Other Ambulatory Visit (INDEPENDENT_AMBULATORY_CARE_PROVIDER_SITE_OTHER)

## 2023-09-06 DIAGNOSIS — E559 Vitamin D deficiency, unspecified: Secondary | ICD-10-CM | POA: Diagnosis not present

## 2023-09-06 DIAGNOSIS — Z0184 Encounter for antibody response examination: Secondary | ICD-10-CM

## 2023-09-06 DIAGNOSIS — N760 Acute vaginitis: Secondary | ICD-10-CM

## 2023-09-06 LAB — VITAMIN D 25 HYDROXY (VIT D DEFICIENCY, FRACTURES): VITD: 23.1 ng/mL — ABNORMAL LOW (ref 30.00–100.00)

## 2023-09-06 MED ORDER — METRONIDAZOLE 500 MG PO TABS: 500.0000 mg | ORAL_TABLET | Freq: Two times a day (BID) | ORAL | 0 refills | Status: DC

## 2023-09-06 NOTE — Telephone Encounter (Signed)
 Good morning ladies I thought this pt canceled but she was just rescheduled for later. I just need an order for her

## 2023-09-06 NOTE — Telephone Encounter (Signed)
 Patient requesting TB screen. Verbal okay per Waddell. Order entered.

## 2023-09-06 NOTE — Addendum Note (Signed)
 Addended by: Kairo Laubacher L on: 09/06/2023 08:06 AM   Modules accepted: Orders

## 2023-09-06 NOTE — Telephone Encounter (Signed)
 Good morning ladies this pt is on the lab schedule for today I just need an order if necessary

## 2023-09-06 NOTE — Telephone Encounter (Signed)
 Orders placed.

## 2023-09-06 NOTE — Telephone Encounter (Signed)
 Duplicate request. Already placed orders.

## 2023-09-06 NOTE — Addendum Note (Signed)
 Addended by: Arcadio Cope L on: 09/06/2023 10:01 AM   Modules accepted: Orders

## 2023-09-06 NOTE — Telephone Encounter (Signed)
 Needed Vitamin D  rechecked. Order placed.

## 2023-09-07 ENCOUNTER — Ambulatory Visit: Payer: Self-pay | Admitting: Family Medicine

## 2023-09-08 LAB — QUANTIFERON-TB GOLD PLUS
Mitogen-NIL: 7.06 [IU]/mL
NIL: 0.02 [IU]/mL
QuantiFERON-TB Gold Plus: NEGATIVE
TB1-NIL: 0 [IU]/mL
TB2-NIL: 0 [IU]/mL

## 2023-09-19 ENCOUNTER — Ambulatory Visit
Admission: EM | Admit: 2023-09-19 | Discharge: 2023-09-19 | Disposition: A | Discharge status: Home / Self Care | Attending: Emergency Medicine | Admitting: Emergency Medicine

## 2023-09-19 ENCOUNTER — Encounter: Payer: Self-pay | Admitting: Emergency Medicine

## 2023-09-19 DIAGNOSIS — R051 Acute cough: Secondary | ICD-10-CM | POA: Diagnosis not present

## 2023-09-19 DIAGNOSIS — Z3201 Encounter for pregnancy test, result positive: Secondary | ICD-10-CM | POA: Diagnosis not present

## 2023-09-19 DIAGNOSIS — J069 Acute upper respiratory infection, unspecified: Secondary | ICD-10-CM

## 2023-09-19 DIAGNOSIS — Z3A01 Less than 8 weeks gestation of pregnancy: Secondary | ICD-10-CM

## 2023-09-19 DIAGNOSIS — M549 Dorsalgia, unspecified: Secondary | ICD-10-CM

## 2023-09-19 LAB — POC SOFIA SARS ANTIGEN FIA: SARS Coronavirus 2 Ag: NEGATIVE

## 2023-09-19 LAB — POCT URINE PREGNANCY: Preg Test, Ur: POSITIVE — AB

## 2023-09-19 NOTE — ED Triage Notes (Addendum)
 Patient in office today complaint of cough ,sorethroat  and congestion x2d with mucus (green/yellow). Also upper back pain x 2wks  Patient also stated she took a home HCG test that stated positive  OTC: Dayquil  Denies: Fever, N/V
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
## 2023-09-19 NOTE — Discharge Instructions (Addendum)
 Your pregnancy test is positive.  Take prenatal vitamins as directed and schedule an appointment with your OB/GYN.  Do not take any over-the-counter products unless approved by your OB/GYN.  See the attached handout on first trimester pregnancy.    Your COVID test is negative.  Take Tylenol  as needed for fever or discomfort.  Follow-up with your primary care provider if you are not improving.

## 2023-10-20 ENCOUNTER — Other Ambulatory Visit: Payer: Self-pay

## 2023-10-20 ENCOUNTER — Ambulatory Visit
Admission: EM | Admit: 2023-10-20 | Discharge: 2023-10-20 | Disposition: A | Discharge status: Home / Self Care | Source: Ambulatory Visit

## 2023-10-20 DIAGNOSIS — S61217A Laceration without foreign body of left little finger without damage to nail, initial encounter: Secondary | ICD-10-CM | POA: Diagnosis not present
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
## 2023-10-20 NOTE — ED Triage Notes (Signed)
 Pt presents with a chief complaint of right pinky finger laceration. States it occurred yesterday at approximately 2-3 PM. Accidentally cut finger when she was slicing fruit. Only pain to the touch. Pt reports her tetanus vaccine is up-to-date. No bleeding present on assessment.

## 2023-11-21 ENCOUNTER — Other Ambulatory Visit: Payer: Self-pay

## 2023-11-21 ENCOUNTER — Encounter: Payer: Self-pay | Admitting: Emergency Medicine

## 2023-11-21 ENCOUNTER — Ambulatory Visit
Admission: EM | Admit: 2023-11-21 | Discharge: 2023-11-21 | Disposition: A | Discharge status: Home / Self Care | Attending: Family Medicine | Admitting: Family Medicine

## 2023-11-21 DIAGNOSIS — R051 Acute cough: Secondary | ICD-10-CM

## 2023-11-21 DIAGNOSIS — J069 Acute upper respiratory infection, unspecified: Secondary | ICD-10-CM

## 2023-11-21 DIAGNOSIS — J029 Acute pharyngitis, unspecified: Secondary | ICD-10-CM | POA: Diagnosis not present

## 2023-11-21 LAB — POCT RAPID STREP A (OFFICE): Rapid Strep A Screen: NEGATIVE

## 2023-11-21 LAB — POC SOFIA SARS ANTIGEN FIA: SARS Coronavirus 2 Ag: NEGATIVE

## 2023-11-21 MED ORDER — BENZONATATE 200 MG PO CAPS: 200.0000 mg | ORAL_CAPSULE | Freq: Three times a day (TID) | ORAL | 0 refills | Status: DC | PRN
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
## 2023-11-21 NOTE — ED Triage Notes (Signed)
 Pt here for nasal congestion and sore throat x 3 days; pt sts some cough

## 2023-11-21 NOTE — Discharge Instructions (Addendum)
 You were seen today for upper respiratory symptoms.  Your strep test and covid test were negative today.  I will send the strep test for culture and if positive you will be notified.  Your symptoms are likely viral at this time.  I have sent out a cough medication for you today.  I recommend rest, fluids, tylenol , motrin  and warm salt water gargles as needed.  Please return if not improving or worsening.

## 2023-11-23 ENCOUNTER — Ambulatory Visit (HOSPITAL_COMMUNITY): Payer: Self-pay

## 2023-11-23 LAB — CULTURE, GROUP A STREP (THRC)

## 2023-11-29 ENCOUNTER — Other Ambulatory Visit: Payer: Self-pay | Admitting: Nurse Practitioner

## 2023-11-29 DIAGNOSIS — N760 Acute vaginitis: Secondary | ICD-10-CM

## 2023-11-29 MED ORDER — METRONIDAZOLE 500 MG PO TABS: 500.0000 mg | ORAL_TABLET | Freq: Two times a day (BID) | ORAL | 0 refills | Status: DC

## 2024-01-15 ENCOUNTER — Ambulatory Visit: Admitting: Nurse Practitioner

## 2024-01-15 ENCOUNTER — Encounter: Payer: Self-pay | Admitting: Nurse Practitioner

## 2024-01-15 VITALS — BP 110/74 | HR 89 | Resp 16

## 2024-01-15 DIAGNOSIS — N9089 Other specified noninflammatory disorders of vulva and perineum: Secondary | ICD-10-CM

## 2024-01-15 DIAGNOSIS — B3731 Acute candidiasis of vulva and vagina: Secondary | ICD-10-CM | POA: Diagnosis not present

## 2024-01-15 DIAGNOSIS — N760 Acute vaginitis: Secondary | ICD-10-CM

## 2024-01-15 DIAGNOSIS — B9689 Other specified bacterial agents as the cause of diseases classified elsewhere: Secondary | ICD-10-CM

## 2024-01-15 DIAGNOSIS — Z113 Encounter for screening for infections with a predominantly sexual mode of transmission: Secondary | ICD-10-CM

## 2024-01-15 DIAGNOSIS — N898 Other specified noninflammatory disorders of vagina: Secondary | ICD-10-CM

## 2024-01-15 LAB — WET PREP FOR TRICH, YEAST, CLUE

## 2024-01-15 MED ORDER — FLUCONAZOLE 150 MG PO TABS: 150.0000 mg | ORAL_TABLET | ORAL | 0 refills | Status: AC

## 2024-01-15 MED ORDER — METRONIDAZOLE 500 MG PO TABS: 500.0000 mg | ORAL_TABLET | Freq: Two times a day (BID) | ORAL | 0 refills | Status: AC

## 2024-01-15 NOTE — Progress Notes (Signed)
" ° °  Acute Office Visit  Subjective:    Patient ID: Sonya Hatfield, female    DOB: 09/14/1995, 29 y.o.   MRN: [id number]   HPI 29 y.o. presents today for vaginal itching and irritation. Has noticed an increase in discharge since switching to different probiotic. Would like STD screening.   Patient's last menstrual period was 12/31/2023. Period Duration (Days): 4-5 Period Pattern: Regular Menstrual Flow:  (heavy for 2 days) Menstrual Control: Tampon, Maxi pad Dysmenorrhea: (!) Mild Dysmenorrhea Symptoms: Cramping  Review of Systems  Constitutional: Negative.   Genitourinary:  Positive for vaginal discharge and vaginal pain (Vulvar irritation).       Objective:    Physical Exam Exam conducted with a chaperone present.  Constitutional:      Appearance: Normal appearance.  Genitourinary:    General: Normal vulva.     Vagina: Vaginal discharge present. No erythema.     Cervix: Normal.     BP 110/74   Pulse 89   Resp 16   LMP 12/31/2023  Wt Readings from Last 3 Encounters:  10/20/23 198 lb (89.8 kg)  09/19/23 200 lb (90.7 kg)  08/25/23 200 lb (90.7 kg)        Sonya Hatfield, CMA present as biomedical engineer.   Wet prep  Assessment & Plan:   Problem List Items Addressed This Visit   None Visit Diagnoses       Bacterial vaginosis    -  Primary   Relevant Medications   metroNIDAZOLE  (FLAGYL ) 500 MG tablet        Vaginal discharge       Relevant Orders   WET PREP FOR TRICH, YEAST, CLUE     Vulvar irritation       Relevant Orders   WET PREP FOR TRICH, YEAST, CLUE     Screening examination for STD (sexually transmitted disease)       Relevant Orders   C. trachomatis/N. gonorrhoeae RNA     Vaginal candidiasis       Relevant Medications      fluconazole  (DIFLUCAN ) 150 MG tablet      Plan: Flagyl  500 mg BID x 7 days. Diflucan  150 mg every 3 days x 2 doses. GC/CT pending. Declines HIV/RPR.   Return if symptoms worsen or fail to improve.    Sonya DELENA Shutter DNP,  9:05 AM 01/15/2024 "

## 2024-01-17 ENCOUNTER — Ambulatory Visit: Payer: Self-pay | Admitting: Nurse Practitioner

## 2024-01-17 LAB — C. TRACHOMATIS/N. GONORRHOEAE RNA
C. trachomatis RNA, TMA: NOT DETECTED
N. gonorrhoeae RNA, TMA: NOT DETECTED
